# Patient Record
Sex: Male | Born: 1940 | Race: White | Hispanic: No | Marital: Married | Smoking: Former smoker
Health system: Southern US, Community
[De-identification: ages and names within clinical notes are randomized; demographics above are authoritative.]

## PROBLEM LIST (undated history)

## (undated) DIAGNOSIS — R112 Nausea with vomiting, unspecified: Secondary | ICD-10-CM

## (undated) DIAGNOSIS — IMO0001 Reserved for inherently not codable concepts without codable children: Secondary | ICD-10-CM

## (undated) DIAGNOSIS — Z9889 Other specified postprocedural states: Secondary | ICD-10-CM

## (undated) DIAGNOSIS — I10 Essential (primary) hypertension: Secondary | ICD-10-CM

## (undated) DIAGNOSIS — J45909 Unspecified asthma, uncomplicated: Secondary | ICD-10-CM

## (undated) DIAGNOSIS — N189 Chronic kidney disease, unspecified: Secondary | ICD-10-CM

## (undated) DIAGNOSIS — N289 Disorder of kidney and ureter, unspecified: Secondary | ICD-10-CM

## (undated) DIAGNOSIS — H9190 Unspecified hearing loss, unspecified ear: Secondary | ICD-10-CM

## (undated) DIAGNOSIS — J189 Pneumonia, unspecified organism: Secondary | ICD-10-CM

## (undated) DIAGNOSIS — K56600 Partial intestinal obstruction, unspecified as to cause: Secondary | ICD-10-CM

## (undated) DIAGNOSIS — K219 Gastro-esophageal reflux disease without esophagitis: Secondary | ICD-10-CM

## (undated) DIAGNOSIS — M1990 Unspecified osteoarthritis, unspecified site: Secondary | ICD-10-CM

## (undated) DIAGNOSIS — M109 Gout, unspecified: Secondary | ICD-10-CM

## (undated) DIAGNOSIS — J449 Chronic obstructive pulmonary disease, unspecified: Secondary | ICD-10-CM

## (undated) HISTORY — DX: Gastro-esophageal reflux disease without esophagitis: K21.9

## (undated) HISTORY — PX: CATARACT EXTRACTION W/ INTRAOCULAR LENS  IMPLANT, BILATERAL: SHX1307

## (undated) HISTORY — PX: APPENDECTOMY: SHX54

## (undated) HISTORY — DX: Essential (primary) hypertension: I10

## (undated) HISTORY — DX: Chronic kidney disease, unspecified: N18.9

## (undated) HISTORY — DX: Chronic obstructive pulmonary disease, unspecified: J44.9

## (undated) HISTORY — PX: PROSTATE SURGERY: SHX751

## (undated) HISTORY — PX: CARDIAC CATHETERIZATION: SHX172

## (undated) HISTORY — PX: TONSILECTOMY/ADENOIDECTOMY WITH MYRINGOTOMY: SHX6125

## (undated) HISTORY — DX: Unspecified asthma, uncomplicated: J45.909

## (undated) HISTORY — PX: POSTERIOR LUMBAR FUSION: SHX6036

## (undated) HISTORY — PX: NASAL SEPTUM SURGERY: SHX37

---
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
## 1983-11-04 DIAGNOSIS — Z9889 Other specified postprocedural states: Secondary | ICD-10-CM

## 1983-11-04 DIAGNOSIS — R112 Nausea with vomiting, unspecified: Secondary | ICD-10-CM

## 1983-11-04 HISTORY — DX: Nausea with vomiting, unspecified: R11.2

## 1983-11-04 HISTORY — DX: Other specified postprocedural states: Z98.890

## 2014-11-10 ENCOUNTER — Encounter: Payer: Self-pay | Admitting: *Deleted

## 2014-11-30 ENCOUNTER — Telehealth: Payer: Self-pay | Admitting: *Deleted

## 2014-11-30 ENCOUNTER — Other Ambulatory Visit: Payer: Self-pay | Admitting: Family Medicine

## 2014-11-30 ENCOUNTER — Encounter: Payer: Self-pay | Admitting: Family Medicine

## 2014-11-30 ENCOUNTER — Ambulatory Visit (INDEPENDENT_AMBULATORY_CARE_PROVIDER_SITE_OTHER): Payer: Medicare Other | Admitting: Family Medicine

## 2014-11-30 VITALS — BP 138/72 | HR 88 | Temp 98.0°F | Resp 12 | Ht 66.54 in | Wt 209.0 lb

## 2014-11-30 DIAGNOSIS — Z23 Encounter for immunization: Secondary | ICD-10-CM | POA: Diagnosis not present

## 2014-11-30 DIAGNOSIS — M1000 Idiopathic gout, unspecified site: Secondary | ICD-10-CM | POA: Diagnosis not present

## 2014-11-30 DIAGNOSIS — Z1211 Encounter for screening for malignant neoplasm of colon: Secondary | ICD-10-CM

## 2014-11-30 DIAGNOSIS — Z0000 Encounter for general adult medical examination without abnormal findings: Secondary | ICD-10-CM | POA: Diagnosis not present

## 2014-11-30 LAB — COMPLETE METABOLIC PANEL WITH GFR
ALK PHOS: 53 U/L (ref 39–117)
ALT: 16 U/L (ref 0–53)
AST: 22 U/L (ref 0–37)
Albumin: 4.3 g/dL (ref 3.5–5.2)
BILIRUBIN TOTAL: 0.5 mg/dL (ref 0.2–1.2)
BUN: 27 mg/dL — ABNORMAL HIGH (ref 6–23)
CALCIUM: 8.7 mg/dL (ref 8.4–10.5)
CO2: 23 meq/L (ref 19–32)
CREATININE: 1.41 mg/dL — AB (ref 0.50–1.35)
Chloride: 105 mEq/L (ref 96–112)
GFR, EST AFRICAN AMERICAN: 57 mL/min — AB
GFR, EST NON AFRICAN AMERICAN: 49 mL/min — AB
Glucose, Bld: 98 mg/dL (ref 70–99)
Potassium: 4.2 mEq/L (ref 3.5–5.3)
Sodium: 142 mEq/L (ref 135–145)
Total Protein: 6.7 g/dL (ref 6.0–8.3)

## 2014-11-30 LAB — CBC WITH DIFFERENTIAL/PLATELET
Basophils Absolute: 0.1 10*3/uL (ref 0.0–0.1)
Basophils Relative: 1 % (ref 0–1)
EOS ABS: 0.3 10*3/uL (ref 0.0–0.7)
Eosinophils Relative: 4 % (ref 0–5)
HEMATOCRIT: 44.7 % (ref 39.0–52.0)
Hemoglobin: 15.3 g/dL (ref 13.0–17.0)
LYMPHS ABS: 1.9 10*3/uL (ref 0.7–4.0)
LYMPHS PCT: 27 % (ref 12–46)
MCH: 29.1 pg (ref 26.0–34.0)
MCHC: 34.2 g/dL (ref 30.0–36.0)
MCV: 85 fL (ref 78.0–100.0)
MONO ABS: 0.9 10*3/uL (ref 0.1–1.0)
MPV: 9.2 fL (ref 8.6–12.4)
Monocytes Relative: 12 % (ref 3–12)
NEUTROS ABS: 4 10*3/uL (ref 1.7–7.7)
NEUTROS PCT: 56 % (ref 43–77)
PLATELETS: 186 10*3/uL (ref 150–400)
RBC: 5.26 MIL/uL (ref 4.22–5.81)
RDW: 14.8 % (ref 11.5–15.5)
WBC: 7.1 10*3/uL (ref 4.0–10.5)

## 2014-11-30 LAB — URIC ACID: Uric Acid, Serum: 6.6 mg/dL (ref 4.0–7.8)

## 2014-11-30 LAB — LIPID PANEL
CHOLESTEROL: 177 mg/dL (ref 0–200)
HDL: 43 mg/dL (ref >39)
LDL CALC: 112 mg/dL — AB (ref 0–99)
TRIGLYCERIDES: 112 mg/dL (ref <150)
Total CHOL/HDL Ratio: 4.1 Ratio
VLDL: 22 mg/dL (ref 0–40)

## 2014-11-30 NOTE — Telephone Encounter (Signed)
Pt wife called stating she went to [facility] to pick up pts ear gtts and pharmacist says there isnt a prescription sent in for him. Pt says you were going to call something in for him when they were here today. Please advise!  [place]

## 2014-11-30 NOTE — Progress Notes (Signed)
Subjective:    Patient ID: Larry Butler, male    DOB: Aug 16, 1941, 74 y.o.   MRN: [id number]  HPI Patient is a very pleasant 74 year old white male who is here today to establish care and for complete physical exam. He is also requesting referrals to a urologist as well as a cardiologist. Patient has a history of BPH and underwent a TUNA proximally one year ago. He is due to see his urologist in [place] again in August. He would like to schedule follow-up with a urologist here in [facility] for August. He also apparently has been seeing a cardiologist however the reason is not clear and coronary artery disease, cardiac arrhythmia, congestive heart failure. However he was seen a cardiologist in [place]. Patient has had Pneumovax 23 as well as the shingles vaccine and his flu shot. He is due for the tetanus vaccine as well as Prevnar 13. He is also due for a colonoscopy as he has a history of polyps, a family history of colon cancer, and he has not had a colonoscopy in more than 5 years. Past Medical History  Diagnosis Date  . Asthma   . COPD (chronic obstructive pulmonary disease)   . GERD (gastroesophageal reflux disease)   . Chronic kidney disease    Past Surgical History  Procedure Laterality Date  . Appendectomy    . Tonsilectomy/adenoidectomy with myringotomy    . Back surgery      fusion 1980's  . Prostate surgery     Current Outpatient Prescriptions on File Prior to Visit  Medication Sig Dispense Refill  . albuterol (PROVENTIL HFA;VENTOLIN HFA) 108 (90 BASE) MCG/ACT inhaler Inhale 2 puffs into the lungs every 6 (six) hours as needed for wheezing or shortness of breath.    . allopurinol (ZYLOPRIM) 300 MG tablet Take 300 mg by mouth daily.    Marland Kitchen aspirin EC 81 MG tablet Take 81 mg by mouth daily.    Marland Kitchen esomeprazole (NEXIUM) 40 MG capsule Take 40 mg by mouth daily at 12 noon.    . Multiple Vitamins-Minerals (CENTRUM SILVER PO) Take 1 tablet by mouth daily.     No current  facility-administered medications on file prior to visit.   No Known Allergies History   Social History  . Marital Status: Married    Spouse Name: N/A    Number of Children: N/A  . Years of Education: N/A   Occupational History  . Not on file.   Social History Main Topics  . Smoking status: Former Smoker    Quit date: 09/03/1977  . Smokeless tobacco: Never Used  . Alcohol Use: No  . Drug Use: No  . Sexual Activity: Yes   Other Topics Concern  . Not on file   Social History Narrative   Family History  Problem Relation Age of Onset  . Cancer Mother     breast  . Stroke Mother   . Cancer Father 73    colon  . Diabetes Sister   . Heart disease Brother   . Diabetes Brother   . Hearing loss Brother   . Diabetes Brother   . Diabetes Brother   . Diabetes Brother   . Heart disease Brother   . Hearing loss Brother   . Cancer Brother     prostate  . Heart disease Brother   . Hearing loss Brother   . COPD Brother       Review of Systems  All other systems reviewed and are negative.  Objective:   Physical Exam  Constitutional: He is oriented to person, place, and time. He appears well-developed and well-nourished. No distress.  HENT:  Head: Normocephalic and atraumatic.  Right Ear: External ear normal.  Left Ear: External ear normal.  Nose: Nose normal.  Mouth/Throat: Oropharynx is clear and moist. No oropharyngeal exudate.  Eyes: Conjunctivae and EOM are normal. Pupils are equal, round, and reactive to light. Right eye exhibits no discharge. Left eye exhibits no discharge. No scleral icterus.  Neck: Normal range of motion. Neck supple. No JVD present. No tracheal deviation present. No thyromegaly present.  Cardiovascular: Normal rate, regular rhythm, normal heart sounds and intact distal pulses.  Exam reveals no gallop and no friction rub.   No murmur heard. Pulmonary/Chest: Effort normal and breath sounds normal. No stridor. No respiratory distress. He  has no wheezes. He has no rales. He exhibits no tenderness.  Abdominal: Soft. Bowel sounds are normal. He exhibits no distension and no mass. There is no tenderness. There is no rebound and no guarding.  Musculoskeletal: Normal range of motion. He exhibits no edema or tenderness.  Lymphadenopathy:    He has no cervical adenopathy.  Neurological: He is alert and oriented to person, place, and time. He has normal reflexes. He displays normal reflexes. No cranial nerve deficit. He exhibits normal muscle tone. Coordination normal.  Skin: Skin is warm. No rash noted. He is not diaphoretic. No erythema. No pallor.  Psychiatric: He has a normal mood and affect. His behavior is normal. Judgment and thought content normal.  Vitals reviewed.         Assessment & Plan:  Routine general medical examination at a health care facility - Plan: COMPLETE METABOLIC PANEL WITH GFR, Lipid panel, CBC with Differential/Platelet  Acute idiopathic gout, unspecified site - Plan: Uric acid  Need for prophylactic vaccination against Streptococcus pneumoniae (pneumococcus) - Plan: Pneumococcal conjugate vaccine 13-valent  Need for prophylactic vaccination with combined diphtheria-tetanus-pertussis (DTP) vaccine - Plan: Tdap vaccine greater than or equal to 7yo IM  Patient was given a tetanus vaccine today. He was also given Prevnar 13. I will schedule the patient to see a gastroenterologist for colonoscopy. The patient will call me in August when he is ready for me to refer him to a urologist. I have asked to receive a copy of his previous medical records to determine the reason the patient needs to be referred to a cardiologist. I will also check a CBC, CMP, fasting lipid panel, and uric acid level.

## 2014-12-01 ENCOUNTER — Other Ambulatory Visit: Payer: Self-pay | Admitting: Family Medicine

## 2014-12-01 MED ORDER — NEOMYCIN-POLYMYXIN-HC 3.5-10000-1 OT SOLN: 4.0000 [drp] | Freq: Four times a day (QID) | OTIC | Status: DC

## 2014-12-01 NOTE — Telephone Encounter (Signed)
Called and spoke to pt wife Gregary Signs and she is aware that script had been called in

## 2014-12-01 NOTE — Telephone Encounter (Signed)
Please tell him I am sorry and I have sent the rx right now.

## 2014-12-11 ENCOUNTER — Other Ambulatory Visit: Payer: Self-pay | Admitting: Family Medicine

## 2014-12-11 DIAGNOSIS — I2510 Atherosclerotic heart disease of native coronary artery without angina pectoris: Secondary | ICD-10-CM

## 2014-12-18 ENCOUNTER — Telehealth: Payer: Self-pay | Admitting: Cardiology

## 2014-12-18 NOTE — Telephone Encounter (Signed)
Received records from [facility] for appointment with Dr Percival Spanish on 02/05/15.  Records given to [facility] (medical records) for  Dr Hochrein's schedule on 02/05/15.  lp

## 2015-01-31 ENCOUNTER — Encounter: Payer: Self-pay | Admitting: *Deleted

## 2015-02-03 ENCOUNTER — Other Ambulatory Visit: Payer: Self-pay | Admitting: Family Medicine

## 2015-02-05 ENCOUNTER — Ambulatory Visit (INDEPENDENT_AMBULATORY_CARE_PROVIDER_SITE_OTHER): Payer: Medicare Other | Admitting: Cardiology

## 2015-02-05 ENCOUNTER — Encounter: Payer: Self-pay | Admitting: Cardiology

## 2015-02-05 VITALS — BP 142/84 | HR 69 | Ht 67.0 in | Wt 208.3 lb

## 2015-02-05 DIAGNOSIS — I2510 Atherosclerotic heart disease of native coronary artery without angina pectoris: Secondary | ICD-10-CM

## 2015-02-05 NOTE — Progress Notes (Signed)
Cardiology Office Note   Date:  02/05/2015   ID:  Larry Butler, DOB [birth year], MRN [id number]  PCP:  Odette Fraction, MD  Cardiologist:   Minus Breeding, MD   Chief Complaint  Patient presents with  . Coronary Artery Disease      History of Present Illness: Larry Butler is a 74 y.o. male who presents for follow-up of coronary disease.  He has moved to this area with his wife. He's been followed for years by a cardiologist but has not had any severe problems as far as I can tell.  I did review old records demonstrating a cardiac catheterization in 2008 with apparently nonobstructive disease. A 2013 stress test demonstrated some mild mid inferior ischemia but this was apparently managed medically. His last stress test was in February 2015. He also reports having a cardiac cath which I did not again it was nonobstructive disease.  He is known to have some very mild carotid plaque.  Echocardiogram that I reviewed 2013 demonstrated well-preserved ejection fraction. There was some mild aortic stenosis.  He does quite well. He is very active and actually tons of dirt recently. The patient denies any new symptoms such as chest discomfort, neck or arm discomfort. There has been no new shortness of breath, PND or orthopnea. There have been no reported palpitations, presyncope or syncope.   Past Medical History  Diagnosis Date  . Asthma   . COPD (chronic obstructive pulmonary disease)   . GERD (gastroesophageal reflux disease)   . Chronic kidney disease     Past Surgical History  Procedure Laterality Date  . Appendectomy    . Tonsilectomy/adenoidectomy with myringotomy    . Back surgery      fusion 1980's  . Prostate surgery       Current Outpatient Prescriptions  Medication Sig Dispense Refill  . acetaminophen (TYLENOL) 500 MG tablet Take 500 mg by mouth every 6 (six) hours as needed.    Marland Kitchen albuterol (PROVENTIL HFA;VENTOLIN HFA) 108 (90 BASE) MCG/ACT inhaler Inhale 2  puffs into the lungs every 6 (six) hours as needed for wheezing or shortness of breath.    . allopurinol (ZYLOPRIM) 300 MG tablet Take 300 mg by mouth daily.    Marland Kitchen aspirin EC 81 MG tablet Take 81 mg by mouth daily.    Marland Kitchen esomeprazole (NEXIUM) 40 MG capsule Take 40 mg by mouth daily as needed.     . fluticasone (FLONASE) 50 MCG/ACT nasal spray Place 2 sprays into both nostrils daily as needed.     . Multiple Vitamins-Minerals (CENTRUM SILVER PO) Take 1 tablet by mouth daily.    Marland Kitchen neomycin-polymyxin-hydrocortisone (CORTISPORIN) otic solution Place 4 drops into the right ear 4 (four) times daily. 10 mL 0  . Simethicone 250 MG CAPS Take 1 capsule by mouth as needed.     No current facility-administered medications for this visit.    Allergies:   Review of patient's allergies indicates no known allergies.    Social History:  The patient  reports that he quit smoking about 37 years ago. He has never used smokeless tobacco. He reports that he does not drink alcohol or use illicit drugs.   Family History:  The patient's family history includes COPD in his brother; Cancer in his brother and mother; Cancer (age of onset: 52) in his father; Diabetes in his brother, brother, brother, brother, and sister; Hearing loss in his brother, brother, and brother; Heart disease in his brother; Heart disease (age  of onset: 68) in his brother; Heart disease (age of onset: 36) in his brother; Stroke in his mother.   Of note he has many siblings and it seems like most of his brothers have some heart disease though we don't know all the details.   ROS:  Please see the history of present illness.   Otherwise, review of systems are positive for temporal headaches, reflux.   All other systems are reviewed and negative.    PHYSICAL EXAM: VS:  BP 142/84 mmHg  Pulse 69  Ht 5\' 7"  (1.702 m)  Wt 208 lb 4.8 oz (94.484 kg)  BMI 32.62 kg/m2 , BMI Body mass index is 32.62 kg/(m^2). GENERAL:  Well appearing HEENT:  Pupils equal  round and reactive, fundi not visualized, oral mucosa unremarkable NECK:  No jugular venous distention, waveform within normal limits, carotid upstroke brisk and symmetric, no bruits, no thyromegaly LYMPHATICS:  No cervical, inguinal adenopathy LUNGS:  Clear to auscultation bilaterally BACK:  No CVA tenderness CHEST:  Unremarkable HEART:  PMI not displaced or sustained,S1 and S2 within normal limits, no S3, no S4, no clicks, no rubs, no murmurs ABD:  Flat, positive bowel sounds normal in frequency in pitch, no bruits, no rebound, no guarding, no midline pulsatile mass, no hepatomegaly, no splenomegaly EXT:  2 plus pulses throughout, no edema, no cyanosis no clubbing SKIN:  No rashes no nodules NEURO:  Cranial nerves II through XII grossly intact, motor grossly intact throughout PSYCH:  Cognitively intact, oriented to person place and time    EKG:  EKG is ordered today. The ekg ordered today demonstrates sinus rhythm, rate 69, axis within normal limits, intervals within normal limits, no acute ST-T wave changes.   Recent Labs: 11/30/2014: ALT 16; BUN 27*; Creatinine 1.41*; Hemoglobin 15.3; Platelets 186; Potassium 4.2; Sodium 142    Lipid Panel    Component Value Date/Time   CHOL 177 11/30/2014 0920   TRIG 112 11/30/2014 0920   HDL 43 11/30/2014 0920   CHOLHDL 4.1 11/30/2014 0920   VLDL 22 11/30/2014 0920   LDLCALC 112* 11/30/2014 0920      Wt Readings from Last 3 Encounters:  02/05/15 208 lb 4.8 oz (94.484 kg)  11/30/14 209 lb (94.802 kg)      Other studies Reviewed: Additional studies/ records that were reviewed today include: Extensive outside records. Review of the above records demonstrates:  Please see elsewhere in the note.     ASSESSMENT AND PLAN:  CAD: This is mild and nonobstructive. No further testing needs to be done.:    TEMPORAL PAIN:  This is his most significant complaint. He's had a workup. I suggested possible neurology evaluation since he was not  thought to have temporal arteritis by his report previously.   HTN:  The blood pressure is very mildly elevated. However, this is unusual. No change in therapy is indicated.  CAROTID STENOSIS:  He's had some very mild plaque. I will follow-up on this at the next visit.   Current medicines are reviewed at length with the patient today.  The patient does not have concerns regarding medicines.  The following changes have been made:  no change  Labs/ tests ordered today include: none    Disposition:   FU with me in one year.    Signed, Minus Breeding, MD  02/05/2015 8:37 AM    [facility]

## 2015-02-05 NOTE — Patient Instructions (Signed)
Your physician recommends that you schedule a follow-up appointment in:  One year with Dr. Hochrein  

## 2015-02-06 ENCOUNTER — Telehealth: Payer: Self-pay | Admitting: Cardiology

## 2015-02-06 MED ORDER — AMLODIPINE BESYLATE 2.5 MG PO TABS: 2.5000 mg | ORAL_TABLET | Freq: Every evening | ORAL | Status: DC

## 2015-02-06 NOTE — Telephone Encounter (Signed)
Spoke to pt and advised on new med. She voiced understanding. Will start new med this evening & track BPs this week.

## 2015-02-06 NOTE — Telephone Encounter (Signed)
Pt's wife is calling in to report his most recent BP readings.

## 2015-02-06 NOTE — Telephone Encounter (Signed)
[id number]

## 2015-02-06 NOTE — Telephone Encounter (Signed)
Wife reported pt's BPs.  4/4 8pm - 153/70    HR 67  4/5 7:41am   197/105   HR 103    (during intercourse, & he had temple pain assoc w/ this)       7:48am   163/79    HR   95    (post-intercourse)       7:51am   135/70    HR   81  She wanted Dr. Percival Spanish aware of this, per their discussion at [place] yesterday. Would like a callback w/ any advice.

## 2015-02-06 NOTE — Telephone Encounter (Signed)
I would suggest starting 2.5 mg of Norvasc each evening.

## 2015-03-07 ENCOUNTER — Other Ambulatory Visit: Payer: Self-pay | Admitting: Family Medicine

## 2015-03-07 MED ORDER — ESOMEPRAZOLE MAGNESIUM 40 MG PO CPDR: 40.0000 mg | DELAYED_RELEASE_CAPSULE | Freq: Every day | ORAL | Status: DC | PRN

## 2015-03-07 MED ORDER — ALBUTEROL SULFATE HFA 108 (90 BASE) MCG/ACT IN AERS: 2.0000 | INHALATION_SPRAY | Freq: Four times a day (QID) | RESPIRATORY_TRACT | Status: DC | PRN

## 2015-03-07 MED ORDER — ALLOPURINOL 300 MG PO TABS: 300.0000 mg | ORAL_TABLET | Freq: Every day | ORAL | Status: DC

## 2015-03-16 ENCOUNTER — Other Ambulatory Visit: Payer: Self-pay | Admitting: Family Medicine

## 2015-03-16 ENCOUNTER — Telehealth: Payer: Self-pay | Admitting: Cardiology

## 2015-03-16 ENCOUNTER — Other Ambulatory Visit: Payer: Self-pay

## 2015-03-16 MED ORDER — AMLODIPINE BESYLATE 2.5 MG PO TABS: 2.5000 mg | ORAL_TABLET | Freq: Every evening | ORAL | Status: DC

## 2015-03-16 NOTE — Telephone Encounter (Signed)
°  1. Which medications need to be refilled? Amlodipine-need new prescription,going on a trip.he does not want it to run out  2. Which pharmacy is medication to be sent to?[facility]-[phone]  3. Do they need a 30 day or 90 day supply? #60 and refills  4. Would they like a call back once the medication has been sent to the pharmacy? yes

## 2015-06-05 ENCOUNTER — Ambulatory Visit (INDEPENDENT_AMBULATORY_CARE_PROVIDER_SITE_OTHER): Payer: Medicare Other | Admitting: Family Medicine

## 2015-06-05 ENCOUNTER — Encounter: Payer: Self-pay | Admitting: Family Medicine

## 2015-06-05 VITALS — BP 120/74 | HR 80 | Temp 98.2°F | Resp 16 | Ht 67.0 in | Wt 212.0 lb

## 2015-06-05 DIAGNOSIS — I10 Essential (primary) hypertension: Secondary | ICD-10-CM | POA: Diagnosis not present

## 2015-06-05 LAB — COMPLETE METABOLIC PANEL WITH GFR
ALBUMIN: 4.2 g/dL (ref 3.6–5.1)
ALK PHOS: 53 U/L (ref 40–115)
ALT: 20 U/L (ref 9–46)
AST: 20 U/L (ref 10–35)
BUN: 21 mg/dL (ref 7–25)
CALCIUM: 9.1 mg/dL (ref 8.6–10.3)
CHLORIDE: 105 mmol/L (ref 98–110)
CO2: 26 mmol/L (ref 20–31)
CREATININE: 1.34 mg/dL — AB (ref 0.70–1.18)
GFR, EST NON AFRICAN AMERICAN: 53 mL/min — AB (ref >=60)
GFR, Est African American: 61 mL/min (ref >=60)
Glucose, Bld: 97 mg/dL (ref 70–99)
Potassium: 4 mmol/L (ref 3.5–5.3)
Sodium: 143 mmol/L (ref 135–146)
TOTAL PROTEIN: 6.7 g/dL (ref 6.1–8.1)
Total Bilirubin: 0.6 mg/dL (ref 0.2–1.2)

## 2015-06-05 LAB — CBC WITH DIFFERENTIAL/PLATELET
BASOS PCT: 1 % (ref 0–1)
Basophils Absolute: 0.1 10*3/uL (ref 0.0–0.1)
EOS PCT: 3 % (ref 0–5)
Eosinophils Absolute: 0.2 10*3/uL (ref 0.0–0.7)
HEMATOCRIT: 43 % (ref 39.0–52.0)
Hemoglobin: 15 g/dL (ref 13.0–17.0)
Lymphocytes Relative: 23 % (ref 12–46)
Lymphs Abs: 1.3 10*3/uL (ref 0.7–4.0)
MCH: 29.8 pg (ref 26.0–34.0)
MCHC: 34.9 g/dL (ref 30.0–36.0)
MCV: 85.5 fL (ref 78.0–100.0)
MONOS PCT: 9 % (ref 3–12)
MPV: 9 fL (ref 8.6–12.4)
Monocytes Absolute: 0.5 10*3/uL (ref 0.1–1.0)
NEUTROS ABS: 3.7 10*3/uL (ref 1.7–7.7)
Neutrophils Relative %: 64 % (ref 43–77)
PLATELETS: 191 10*3/uL (ref 150–400)
RBC: 5.03 MIL/uL (ref 4.22–5.81)
RDW: 14.8 % (ref 11.5–15.5)
WBC: 5.8 10*3/uL (ref 4.0–10.5)

## 2015-06-05 LAB — LIPID PANEL
CHOL/HDL RATIO: 3.9 ratio (ref <=5.0)
Cholesterol: 182 mg/dL (ref 125–200)
HDL: 47 mg/dL (ref >=40)
LDL CALC: 119 mg/dL (ref <130)
Triglycerides: 82 mg/dL (ref <150)
VLDL: 16 mg/dL (ref <30)

## 2015-06-05 NOTE — Progress Notes (Signed)
Subjective:    Patient ID: Larry Butler, male    DOB: [birth year], 74 y.o.   MRN: [id number]  HPI  Patient has been doing very well since when I last saw him. He is renovating his bathroom. His blood pressure today is well controlled at 120/74 he continues to take Norvasc 2.5 mg by mouth daily. He denies any chest pain shortness of breath or dyspnea on exertion. I have discussed with the patient possibly discontinuing the medication as his blood pressure is so well controlled. However the patient mainly takes the blood pressure  Medication because he would experience extreme spikes in his blood pressure was sexual intercourse. Otherwise he has been doing well. Unfortunately his wife was recently admitted to the hospital with vasovagal syncope, elevated liver function tests secondary to choledocholithiasis and cholecystitis. They're planning a cholecystectomy for tomorrow. Past Medical History  Diagnosis Date  . Asthma   . COPD (chronic obstructive pulmonary disease)   . GERD (gastroesophageal reflux disease)   . Chronic kidney disease    Past Surgical History  Procedure Laterality Date  . Appendectomy    . Tonsilectomy/adenoidectomy with myringotomy    . Back surgery      fusion 1980's  . Prostate surgery     Current Outpatient Prescriptions on File Prior to Visit  Medication Sig Dispense Refill  . albuterol (PROVENTIL HFA;VENTOLIN HFA) 108 (90 BASE) MCG/ACT inhaler Inhale 2 puffs into the lungs every 6 (six) hours as needed for wheezing or shortness of breath. 1 Inhaler 5  . allopurinol (ZYLOPRIM) 300 MG tablet Take 1 tablet (300 mg total) by mouth daily. 30 tablet 5  . amLODipine (NORVASC) 2.5 MG tablet Take 1 tablet (2.5 mg total) by mouth every evening. 30 tablet 6  . aspirin EC 81 MG tablet Take 81 mg by mouth daily.    Marland Kitchen esomeprazole (NEXIUM) 40 MG capsule Take 1 capsule (40 mg total) by mouth daily as needed. 30 capsule 11  . fluticasone (FLONASE) 50 MCG/ACT nasal spray  Place 2 sprays into both nostrils daily as needed.     . Multiple Vitamins-Minerals (CENTRUM SILVER PO) Take 1 tablet by mouth daily.    Marland Kitchen neomycin-polymyxin-hydrocortisone (CORTISPORIN) otic solution INSTILL 4 DROPS IN RIGHT EAR FOUR TIMES DAILY 10 mL 0   No current facility-administered medications on file prior to visit.   No Known Allergies History   Social History  . Marital Status: Married    Spouse Name: N/A  . Number of Children: N/A  . Years of Education: N/A   Occupational History  . Not on file.   Social History Main Topics  . Smoking status: Former Smoker    Quit date: 09/03/1977  . Smokeless tobacco: Never Used  . Alcohol Use: No  . Drug Use: No  . Sexual Activity: Yes   Other Topics Concern  . Not on file   Social History Narrative   One kind of adopted daughter.       Review of Systems  All other systems reviewed and are negative.      Objective:   Physical Exam  Constitutional: He appears well-developed and well-nourished.  Cardiovascular: Normal rate, regular rhythm and normal heart sounds.   No murmur heard. Pulmonary/Chest: Effort normal and breath sounds normal. No respiratory distress. He has no wheezes. He has no rales.  Abdominal: Soft. Bowel sounds are normal. He exhibits no distension. There is no tenderness. There is no rebound and no guarding.  Musculoskeletal: He exhibits no  edema.  Vitals reviewed.         Assessment & Plan:  Benign essential HTN - Plan: COMPLETE METABOLIC PANEL WITH GFR, Lipid panel, CBC with Differential/Platelet  Patient's blood pressure today is well controlled. I will check a fasting lipid panel. Goal LDL cholesterol is less than 130. I will also monitor his renal function as well as his liver function test.

## 2015-06-13 ENCOUNTER — Encounter: Payer: Self-pay | Admitting: Family Medicine

## 2015-06-13 ENCOUNTER — Ambulatory Visit (INDEPENDENT_AMBULATORY_CARE_PROVIDER_SITE_OTHER): Payer: Medicare Other | Admitting: Family Medicine

## 2015-06-13 VITALS — BP 124/78 | HR 76 | Temp 98.2°F | Resp 18 | Wt 210.0 lb

## 2015-06-13 DIAGNOSIS — S83282A Other tear of lateral meniscus, current injury, left knee, initial encounter: Secondary | ICD-10-CM

## 2015-06-13 NOTE — Progress Notes (Signed)
Subjective:    Patient ID: Larry Butler, male    DOB: 08-20-1941, 74 y.o.   MRN: [id number]  HPI Patient presents with several months of left knee pain. Recently it has worsened while he has been renovating his bathroom. He has severe pain over the left medial joint line. The pain seems to be centered posteriorly. On examination today, he has pain with varus and valgus stress on the there is no laxity. He also has a positive Apley grind concerning for a meniscal tear. He also has a slight joint effusion but no erythema Past Medical History  Diagnosis Date  . Asthma   . COPD (chronic obstructive pulmonary disease)   . GERD (gastroesophageal reflux disease)   . Chronic kidney disease    Past Surgical History  Procedure Laterality Date  . Appendectomy    . Tonsilectomy/adenoidectomy with myringotomy    . Back surgery      fusion 1980's  . Prostate surgery     Current Outpatient Prescriptions on File Prior to Visit  Medication Sig Dispense Refill  . albuterol (PROVENTIL HFA;VENTOLIN HFA) 108 (90 BASE) MCG/ACT inhaler Inhale 2 puffs into the lungs every 6 (six) hours as needed for wheezing or shortness of breath. 1 Inhaler 5  . allopurinol (ZYLOPRIM) 300 MG tablet Take 1 tablet (300 mg total) by mouth daily. 30 tablet 5  . amLODipine (NORVASC) 2.5 MG tablet Take 1 tablet (2.5 mg total) by mouth every evening. 30 tablet 6  . aspirin EC 81 MG tablet Take 81 mg by mouth daily.    Marland Kitchen esomeprazole (NEXIUM) 40 MG capsule Take 1 capsule (40 mg total) by mouth daily as needed. 30 capsule 11  . fluticasone (FLONASE) 50 MCG/ACT nasal spray Place 2 sprays into both nostrils daily as needed.     . Multiple Vitamins-Minerals (CENTRUM SILVER PO) Take 1 tablet by mouth daily.    Marland Kitchen neomycin-polymyxin-hydrocortisone (CORTISPORIN) otic solution INSTILL 4 DROPS IN RIGHT EAR FOUR TIMES DAILY 10 mL 0   No current facility-administered medications on file prior to visit.   No Known Allergies Social  History   Social History  . Marital Status: Married    Spouse Name: N/A  . Number of Children: N/A  . Years of Education: N/A   Occupational History  . Not on file.   Social History Main Topics  . Smoking status: Former Smoker    Quit date: 09/03/1977  . Smokeless tobacco: Never Used  . Alcohol Use: No  . Drug Use: No  . Sexual Activity: Yes   Other Topics Concern  . Not on file   Social History Narrative   One kind of adopted daughter.        Review of Systems  All other systems reviewed and are negative.      Objective:   Physical Exam  Cardiovascular: Normal rate, regular rhythm and normal heart sounds.   Pulmonary/Chest: Effort normal and breath sounds normal.  Musculoskeletal:       Left knee: He exhibits decreased range of motion, swelling, effusion and abnormal meniscus. He exhibits no LCL laxity and no MCL laxity. Tenderness found. Medial joint line and MCL tenderness noted.  Vitals reviewed.         Assessment & Plan:  Lateral meniscal tear, left, initial encounter  I suspect the patient has a meniscal tear. After obtaining informed consent, I injected the left knee with a mixture of 2 mL of lidocaine, 2 mL of Marcaine, and 2 mL  of 40 mg per mL Kenalog. The patient tolerated procedure well without complication. If pain worsens or does not improve, I would next proceed with an MRI of the left knee.

## 2015-07-05 ENCOUNTER — Encounter: Payer: Self-pay | Admitting: Family Medicine

## 2015-07-05 ENCOUNTER — Ambulatory Visit (INDEPENDENT_AMBULATORY_CARE_PROVIDER_SITE_OTHER): Payer: Medicare Other | Admitting: Family Medicine

## 2015-07-05 ENCOUNTER — Other Ambulatory Visit: Payer: Self-pay | Admitting: Family Medicine

## 2015-07-05 VITALS — BP 132/80 | HR 78 | Temp 98.9°F | Resp 18 | Ht 67.0 in | Wt 212.0 lb

## 2015-07-05 DIAGNOSIS — R300 Dysuria: Secondary | ICD-10-CM | POA: Diagnosis not present

## 2015-07-05 DIAGNOSIS — R252 Cramp and spasm: Secondary | ICD-10-CM | POA: Diagnosis not present

## 2015-07-05 LAB — BASIC METABOLIC PANEL WITH GFR
BUN: 16 mg/dL (ref 7–25)
CO2: 27 mmol/L (ref 20–31)
Calcium: 9 mg/dL (ref 8.6–10.3)
Chloride: 102 mmol/L (ref 98–110)
Creat: 1.33 mg/dL — ABNORMAL HIGH (ref 0.70–1.18)
GFR, EST AFRICAN AMERICAN: 61 mL/min (ref >=60)
GFR, EST NON AFRICAN AMERICAN: 53 mL/min — AB (ref >=60)
Glucose, Bld: 92 mg/dL (ref 70–99)
Potassium: 4.2 mmol/L (ref 3.5–5.3)
Sodium: 138 mmol/L (ref 135–146)

## 2015-07-05 LAB — URINALYSIS, ROUTINE W REFLEX MICROSCOPIC
Bilirubin Urine: NEGATIVE
Glucose, UA: NEGATIVE
HGB URINE DIPSTICK: NEGATIVE
KETONES UR: NEGATIVE
LEUKOCYTES UA: NEGATIVE
NITRITE: NEGATIVE
PH: 6.5 (ref 5.0–8.0)
PROTEIN: NEGATIVE
Specific Gravity, Urine: 1.01 (ref 1.001–1.035)

## 2015-07-05 MED ORDER — HYDROXYCHLOROQUINE SULFATE 200 MG PO TABS: 200.0000 mg | ORAL_TABLET | Freq: Every day | ORAL | Status: DC

## 2015-07-05 NOTE — Telephone Encounter (Signed)
Refill appropriate and filled per protocol. 

## 2015-07-05 NOTE — Progress Notes (Signed)
Subjective:    Patient ID: Larry Butler, male    DOB: 1941/07/01, 74 y.o.   MRN: [id number]  HPI Patient reports 4 weeks him almost daily constant severe cramps. They're so bad in the evenings, that he frequently wakes up bathing his wife to shoot him because the pain is unrelenting. The cramps will typically be in his feet or in his lower legs. He also reports dark urine with increase foamy bubbles on top. He denies dysuria. He admits that he has not been drinking enough fluid and he has been working harder recently. This likely explains the dark color of the urine as well as his cramps. He is  Trying to address dehydration by increasing his fluid intake over the last day or so however the cramps persist. He has been eating bananas and the cramps continue Past Medical History  Diagnosis Date  . Asthma   . COPD (chronic obstructive pulmonary disease)   . GERD (gastroesophageal reflux disease)   . Chronic kidney disease    Past Surgical History  Procedure Laterality Date  . Appendectomy    . Tonsilectomy/adenoidectomy with myringotomy    . Back surgery      fusion 1980's  . Prostate surgery     Current Outpatient Prescriptions on File Prior to Visit  Medication Sig Dispense Refill  . albuterol (PROVENTIL HFA;VENTOLIN HFA) 108 (90 BASE) MCG/ACT inhaler Inhale 2 puffs into the lungs every 6 (six) hours as needed for wheezing or shortness of breath. 1 Inhaler 5  . allopurinol (ZYLOPRIM) 300 MG tablet Take 1 tablet (300 mg total) by mouth daily. (Patient taking differently: Take 150 mg by mouth daily. ) 30 tablet 5  . amLODipine (NORVASC) 2.5 MG tablet Take 1 tablet (2.5 mg total) by mouth every evening. 30 tablet 6  . aspirin EC 81 MG tablet Take 81 mg by mouth daily.    Marland Kitchen esomeprazole (NEXIUM) 40 MG capsule Take 1 capsule (40 mg total) by mouth daily as needed. 30 capsule 11  . fluticasone (FLONASE) 50 MCG/ACT nasal spray Place 2 sprays into both nostrils daily as needed.     .  Multiple Vitamins-Minerals (CENTRUM SILVER PO) Take 1 tablet by mouth daily.    Marland Kitchen neomycin-polymyxin-hydrocortisone (CORTISPORIN) otic solution INSTILL 4 DROPS IN RIGHT EAR FOUR TIMES DAILY 10 mL 0   No current facility-administered medications on file prior to visit.   No Known Allergies Social History   Social History  . Marital Status: Married    Spouse Name: N/A  . Number of Children: N/A  . Years of Education: N/A   Occupational History  . Not on file.   Social History Main Topics  . Smoking status: Former Smoker    Quit date: 09/03/1977  . Smokeless tobacco: Never Used  . Alcohol Use: No  . Drug Use: No  . Sexual Activity: Yes   Other Topics Concern  . Not on file   Social History Narrative   One kind of adopted daughter.        Review of Systems  All other systems reviewed and are negative.      Objective:   Physical Exam  Cardiovascular: Normal rate, regular rhythm and normal heart sounds.   Pulmonary/Chest: Effort normal and breath sounds normal.  Abdominal: Soft. Bowel sounds are normal. He exhibits no distension. There is no tenderness. There is no rebound.  Musculoskeletal: Normal range of motion. He exhibits no edema or tenderness.  Vitals reviewed.  Assessment & Plan:  Dysuria - Plan: Urinalysis, Routine w reflex microscopic (not at [facility] [place])  Cramps, extremity - Plan: BASIC METABOLIC PANEL WITH GFR, hydroxychloroquine (PLAQUENIL) 200 MG tablet  Patient's urinalysis is normal. I believe the abnormalities he has witnessed his urine have to do a dehydration. I recommended that he increase his fluid intake. This will hopefully help with the cramps as well. Given the severity and persistence of the cramps, we will start the patient on hydroxychloroquine 200 mg by mouth daily. Recheck in 2 weeks. I plan to discontinue this medication in one month once the patient stops renovating his house

## 2015-07-25 ENCOUNTER — Ambulatory Visit (INDEPENDENT_AMBULATORY_CARE_PROVIDER_SITE_OTHER): Payer: Medicare Other | Admitting: Family Medicine

## 2015-07-25 DIAGNOSIS — Z23 Encounter for immunization: Secondary | ICD-10-CM | POA: Diagnosis not present

## 2015-08-27 ENCOUNTER — Ambulatory Visit (INDEPENDENT_AMBULATORY_CARE_PROVIDER_SITE_OTHER): Payer: Medicare Other | Admitting: Family Medicine

## 2015-08-27 ENCOUNTER — Encounter: Payer: Self-pay | Admitting: Family Medicine

## 2015-08-27 VITALS — BP 130/72 | HR 80 | Temp 98.3°F | Resp 18 | Ht 67.0 in | Wt 215.0 lb

## 2015-08-27 DIAGNOSIS — M25562 Pain in left knee: Secondary | ICD-10-CM | POA: Diagnosis not present

## 2015-08-27 NOTE — Progress Notes (Signed)
Subjective:    Patient ID: Larry Butler, male    DOB: [birth year], 74 y.o.   MRN: [id number]  HPI 06/13/15 Patient presents with several months of left knee pain. Recently it has worsened while he has been renovating his bathroom. He has severe pain over the left medial joint line. The pain seems to be centered posteriorly. On examination today, he has pain with varus and valgus stress on the there is no laxity. He also has a positive Apley grind concerning for a meniscal tear. He also has a slight joint effusion but no erythema.  At that time, my plan was: I suspect the patient has a meniscal tear. After obtaining informed consent, I injected the left knee with a mixture of 2 mL of lidocaine, 2 mL of Marcaine, and 2 mL of 40 mg per mL Kenalog. The patient tolerated procedure well without complication. If pain worsens or does not improve, I would next proceed with an MRI of the left knee.  08/27/15 Had been doing great for 2 months.  However recently he has been climbing up and down ladders working on his home and he reaggravated his left knee. The pain is located over the medial joint line. He has a positive Apley grind. He has pain with full flexion. He also has pain in his MCL was checked with valgus stress.   Past Medical History  Diagnosis Date  . Asthma   . COPD (chronic obstructive pulmonary disease)   . GERD (gastroesophageal reflux disease)   . Chronic kidney disease    Past Surgical History  Procedure Laterality Date  . Appendectomy    . Tonsilectomy/adenoidectomy with myringotomy    . Back surgery      fusion 1980's  . Prostate surgery     Current Outpatient Prescriptions on File Prior to Visit  Medication Sig Dispense Refill  . albuterol (PROVENTIL HFA;VENTOLIN HFA) 108 (90 BASE) MCG/ACT inhaler Inhale 2 puffs into the lungs every 6 (six) hours as needed for wheezing or shortness of breath. 1 Inhaler 5  . allopurinol (ZYLOPRIM) 300 MG tablet Take 1 tablet (300 mg total)  by mouth daily. (Patient taking differently: Take 150 mg by mouth daily. ) 30 tablet 5  . amLODipine (NORVASC) 2.5 MG tablet Take 1 tablet (2.5 mg total) by mouth every evening. 30 tablet 6  . aspirin EC 81 MG tablet Take 81 mg by mouth daily.    Marland Kitchen esomeprazole (NEXIUM) 40 MG capsule Take 1 capsule (40 mg total) by mouth daily as needed. 30 capsule 11  . fluticasone (FLONASE) 50 MCG/ACT nasal spray Place 2 sprays into both nostrils daily as needed.     . hydroxychloroquine (PLAQUENIL) 200 MG tablet TAKE 1 TABLET(200 MG) BY MOUTH DAILY 90 tablet 1  . Multiple Vitamins-Minerals (CENTRUM SILVER PO) Take 1 tablet by mouth daily.    Marland Kitchen neomycin-polymyxin-hydrocortisone (CORTISPORIN) otic solution INSTILL 4 DROPS IN RIGHT EAR FOUR TIMES DAILY 10 mL 0   No current facility-administered medications on file prior to visit.   No Known Allergies Social History   Social History  . Marital Status: Married    Spouse Name: N/A  . Number of Children: N/A  . Years of Education: N/A   Occupational History  . Not on file.   Social History Main Topics  . Smoking status: Former Smoker    Quit date: 09/03/1977  . Smokeless tobacco: Never Used  . Alcohol Use: No  . Drug Use: No  . Sexual Activity:  Yes   Other Topics Concern  . Not on file   Social History Narrative   One kind of adopted daughter.        Review of Systems  All other systems reviewed and are negative.      Objective:   Physical Exam  Cardiovascular: Normal rate, regular rhythm and normal heart sounds.   Pulmonary/Chest: Effort normal and breath sounds normal.  Musculoskeletal:       Left knee: He exhibits decreased range of motion, swelling, effusion and abnormal meniscus. He exhibits no LCL laxity and no MCL laxity. Tenderness found. Medial joint line and MCL tenderness noted.  Vitals reviewed.         Assessment & Plan:  Left medial knee pain  I'm concerned the patient may have a medial meniscal tear now and also  an MCL strain. I recommended rest, elevation, ice, and compression.  For the next 2-3 months I want him to take it easy and avoid strenuous activity around his house.  Using sterile technique, I injected the left knee with a mixture of 2 mL of lidocaine, 2 mL of Marcaine, and 2 mL of 40 mg per mL Kenalog. If the patient does not improve over the next week, I would like to proceed with an MRI of the left knee and orthopedics consult

## 2015-10-11 ENCOUNTER — Other Ambulatory Visit: Payer: Self-pay | Admitting: Cardiology

## 2015-10-11 NOTE — Telephone Encounter (Signed)
Rx request sent to pharmacy.  

## 2015-11-20 DIAGNOSIS — H524 Presbyopia: Secondary | ICD-10-CM | POA: Diagnosis not present

## 2015-11-29 ENCOUNTER — Other Ambulatory Visit: Payer: Self-pay | Admitting: Family Medicine

## 2015-11-29 NOTE — Telephone Encounter (Signed)
Patient NTBS.   Refill refused.

## 2015-12-02 ENCOUNTER — Other Ambulatory Visit: Payer: Self-pay | Admitting: Family Medicine

## 2016-01-14 ENCOUNTER — Ambulatory Visit (INDEPENDENT_AMBULATORY_CARE_PROVIDER_SITE_OTHER): Payer: Medicare Other | Admitting: Family Medicine

## 2016-01-14 ENCOUNTER — Encounter: Payer: Self-pay | Admitting: Family Medicine

## 2016-01-14 VITALS — BP 132/68 | HR 98 | Temp 98.5°F | Resp 20 | Ht 67.0 in | Wt 212.0 lb

## 2016-01-14 DIAGNOSIS — S83282A Other tear of lateral meniscus, current injury, left knee, initial encounter: Secondary | ICD-10-CM | POA: Diagnosis not present

## 2016-01-14 DIAGNOSIS — M25562 Pain in left knee: Secondary | ICD-10-CM

## 2016-01-14 DIAGNOSIS — J4521 Mild intermittent asthma with (acute) exacerbation: Secondary | ICD-10-CM

## 2016-01-14 MED ORDER — PREDNISONE 20 MG PO TABS: ORAL_TABLET | ORAL | Status: DC

## 2016-01-14 MED ORDER — AZITHROMYCIN 250 MG PO TABS: ORAL_TABLET | ORAL | Status: DC

## 2016-01-14 NOTE — Progress Notes (Signed)
Subjective:    Patient ID: Larry Butler, male    DOB: May 24, 1941, 75 y.o.   MRN: [id number]  HPI 06/13/15 Patient presents with several months of left knee pain. Recently it has worsened while he has been renovating his bathroom. He has severe pain over the left medial joint line. The pain seems to be centered posteriorly. On examination today, he has pain with varus and valgus stress on the there is no laxity. He also has a positive Apley grind concerning for a meniscal tear. He also has a slight joint effusion but no erythema.  At that time, my plan was: I suspect the patient has a meniscal tear. After obtaining informed consent, I injected the left knee with a mixture of 2 mL of lidocaine, 2 mL of Marcaine, and 2 mL of 40 mg per mL Kenalog. The patient tolerated procedure well without complication. If pain worsens or does not improve, I would next proceed with an MRI of the left knee.  08/27/15 Had been doing great for 2 months.  However recently he has been climbing up and down ladders working on his home and he reaggravated his left knee. The pain is located over the medial joint line. He has a positive Apley grind. He has pain with full flexion. He also has pain in his MCL was checked with valgus stress.  At that time, my plan was: I'm concerned the patient may have a medial meniscal tear now and also an MCL strain. I recommended rest, elevation, ice, and compression.  For the next 2-3 months I want him to take it easy and avoid strenuous activity around his house.  Using sterile technique, I injected the left knee with a mixture of 2 mL of lidocaine, 2 mL of Marcaine, and 2 mL of 40 mg per mL Kenalog. If the patient does not improve over the next week, I would like to proceed with an MRI of the left knee and orthopedics consult  01/14/16 His knee pain improved for approximately 2 months after the previous injection however it has returned and it is just as severe as it was before. The pain  is primarily in his left knee over the posterior medial joint line. He has a positive Apley grind. He has tremendous pain going up and down steps. He has some mild tenderness to palpation over the joint lines bilaterally. He is also developing pain in his right knee. The pain has been present for more than 4 weeks. It is primarily located over the medial joint line. He is also been very sick over the last 3-4 days. He is wheezing, he has chest congestion, he has a cough productive of purulent sputum, he has subjective fevers. On examination he is wheezing with diminished breath sounds bilaterally and bilateral rails Past Medical History  Diagnosis Date  . Asthma   . COPD (chronic obstructive pulmonary disease) ([place])   . GERD (gastroesophageal reflux disease)   . Chronic kidney disease    Past Surgical History  Procedure Laterality Date  . Appendectomy    . Tonsilectomy/adenoidectomy with myringotomy    . Back surgery      fusion 1980's  . Prostate surgery     Current Outpatient Prescriptions on File Prior to Visit  Medication Sig Dispense Refill  . albuterol (PROVENTIL HFA;VENTOLIN HFA) 108 (90 BASE) MCG/ACT inhaler Inhale 2 puffs into the lungs every 6 (six) hours as needed for wheezing or shortness of breath. 1 Inhaler 5  . allopurinol (  ZYLOPRIM) 300 MG tablet TAKE 1 TABLET(300 MG) BY MOUTH DAILY 30 tablet 5  . amLODipine (NORVASC) 2.5 MG tablet TAKE 1 TABLET(2.5 MG) BY MOUTH EVERY EVENING 30 tablet 3  . aspirin EC 81 MG tablet Take 81 mg by mouth daily.    Marland Kitchen esomeprazole (NEXIUM) 40 MG capsule Take 1 capsule (40 mg total) by mouth daily as needed. 30 capsule 11  . fluticasone (FLONASE) 50 MCG/ACT nasal spray Place 2 sprays into both nostrils daily as needed.     . hydroxychloroquine (PLAQUENIL) 200 MG tablet TAKE 1 TABLET(200 MG) BY MOUTH DAILY 90 tablet 1  . Multiple Vitamins-Minerals (CENTRUM SILVER PO) Take 1 tablet by mouth daily.    Marland Kitchen neomycin-polymyxin-hydrocortisone (CORTISPORIN)  otic solution INSTILL 4 DROPS IN RIGHT EAR FOUR TIMES DAILY 10 mL 0   No current facility-administered medications on file prior to visit.   No Known Allergies Social History   Social History  . Marital Status: Married    Spouse Name: N/A  . Number of Children: N/A  . Years of Education: N/A   Occupational History  . Not on file.   Social History Main Topics  . Smoking status: Former Smoker    Quit date: 09/03/1977  . Smokeless tobacco: Never Used  . Alcohol Use: No  . Drug Use: No  . Sexual Activity: Yes   Other Topics Concern  . Not on file   Social History Narrative   One kind of adopted daughter.        Review of Systems  All other systems reviewed and are negative.      Objective:   Physical Exam  Cardiovascular: Normal rate, regular rhythm and normal heart sounds.   Pulmonary/Chest: Effort normal. He has wheezes. He has rales.  Musculoskeletal:       Left knee: He exhibits decreased range of motion, swelling, effusion and abnormal meniscus. He exhibits no LCL laxity and no MCL laxity. Tenderness found. Medial joint line and MCL tenderness noted.  Vitals reviewed.         Assessment & Plan:  Asthmatic bronchitis, mild intermittent, with acute exacerbation - Plan: predniSONE (DELTASONE) 20 MG tablet, azithromycin (ZITHROMAX) 250 MG tablet  Left medial knee pain - Plan: MR Knee Left  Wo Contrast  Lateral meniscal tear, left, initial encounter - Plan: MR Knee Left  Wo Contrast  Patient has asthmatic bronchitis. Begin prednisone taper pack. Use albuterol 2 puffs inhaled every 6 hours as needed. Treat bronchitis with a Z-Pak. I believe the patient has a meniscal tear in his left knee and also underlying arthritis. I will obtain an MRI of the need to characterize further and then consult orthopedics for definitive management. He also has arthritis and possibly meniscal tear in his right knee. We will try the prednisone first for conservative therapy and see if  pain improves.

## 2016-01-23 DIAGNOSIS — L570 Actinic keratosis: Secondary | ICD-10-CM | POA: Diagnosis not present

## 2016-01-23 DIAGNOSIS — L821 Other seborrheic keratosis: Secondary | ICD-10-CM | POA: Diagnosis not present

## 2016-01-23 DIAGNOSIS — L820 Inflamed seborrheic keratosis: Secondary | ICD-10-CM | POA: Diagnosis not present

## 2016-01-24 ENCOUNTER — Ambulatory Visit
Admission: RE | Admit: 2016-01-24 | Discharge: 2016-01-24 | Disposition: A | Discharge status: Home / Self Care | Payer: Medicare Other | Source: Ambulatory Visit | Attending: Family Medicine | Admitting: Family Medicine

## 2016-01-24 ENCOUNTER — Other Ambulatory Visit: Payer: Self-pay | Admitting: Family Medicine

## 2016-01-24 DIAGNOSIS — Z01818 Encounter for other preprocedural examination: Secondary | ICD-10-CM | POA: Diagnosis not present

## 2016-01-24 DIAGNOSIS — S83242A Other tear of medial meniscus, current injury, left knee, initial encounter: Secondary | ICD-10-CM | POA: Diagnosis not present

## 2016-01-24 DIAGNOSIS — Z77018 Contact with and (suspected) exposure to other hazardous metals: Secondary | ICD-10-CM

## 2016-01-24 DIAGNOSIS — M25562 Pain in left knee: Secondary | ICD-10-CM

## 2016-01-24 DIAGNOSIS — S83282A Other tear of lateral meniscus, current injury, left knee, initial encounter: Secondary | ICD-10-CM

## 2016-01-29 ENCOUNTER — Other Ambulatory Visit: Payer: Self-pay | Admitting: Family Medicine

## 2016-01-29 DIAGNOSIS — S83207A Unspecified tear of unspecified meniscus, current injury, left knee, initial encounter: Secondary | ICD-10-CM

## 2016-02-01 ENCOUNTER — Other Ambulatory Visit: Payer: Self-pay | Admitting: Cardiology

## 2016-02-01 NOTE — Telephone Encounter (Signed)
Rx request sent to pharmacy.  

## 2016-02-04 DIAGNOSIS — M170 Bilateral primary osteoarthritis of knee: Secondary | ICD-10-CM | POA: Diagnosis not present

## 2016-02-04 DIAGNOSIS — M1712 Unilateral primary osteoarthritis, left knee: Secondary | ICD-10-CM | POA: Diagnosis not present

## 2016-02-04 DIAGNOSIS — M1711 Unilateral primary osteoarthritis, right knee: Secondary | ICD-10-CM | POA: Diagnosis not present

## 2016-02-06 NOTE — Progress Notes (Signed)
Cardiology Office Note   Date:  02/07/2016   ID:  Larry Butler, DOB 03-28-1941, MRN [id number]  PCP:  Odette Fraction, MD  Cardiologist:   Minus Breeding, MD   No chief complaint on file.   I saw last year and started norvasc since.  Nothing else in the chart    History of Present Illness: CASS RILL is a 75 y.o. male who presents for follow-up of coronary disease.  He moved to this area with his wife last year.  He's been followed for years by a cardiologist but has not had any severe problems as far as I can tell.   Acardiac catheterization in 2008 with apparently nonobstructive disease. A 2013 stress test demonstrated some mild mid inferior ischemia but this was apparently managed medically. His last stress test was in February 2015.   Echocardiogram that I reviewed 2013 demonstrated well-preserved ejection fraction. There was some mild aortic stenosis.  He does quite well. He is very active.. The patient denies any new symptoms such as chest discomfort, neck or arm discomfort. There has been no new PND or orthopnea. There have been no reported palpitations, presyncope or syncope.  He does have some shortness of breath and he is concerned because he has exposure to assess this and was getting chest x-rays routinely.   Past Medical History  Diagnosis Date  . Asthma   . COPD (chronic obstructive pulmonary disease) ([place])   . GERD (gastroesophageal reflux disease)   . Chronic kidney disease     Past Surgical History  Procedure Laterality Date  . Appendectomy    . Tonsilectomy/adenoidectomy with myringotomy    . Back surgery      fusion 1980's  . Prostate surgery       Current Outpatient Prescriptions  Medication Sig Dispense Refill  . albuterol (PROVENTIL HFA;VENTOLIN HFA) 108 (90 BASE) MCG/ACT inhaler Inhale 2 puffs into the lungs every 6 (six) hours as needed for wheezing or shortness of breath. 1 Inhaler 5  . allopurinol (ZYLOPRIM) 300 MG tablet TAKE 1  TABLET(300 MG) BY MOUTH DAILY 30 tablet 5  . amLODipine (NORVASC) 2.5 MG tablet TAKE 1 TABLET(2.5 MG) BY MOUTH EVERY EVENING 30 tablet 0  . aspirin EC 81 MG tablet Take 81 mg by mouth daily.    Marland Kitchen azithromycin (ZITHROMAX) 250 MG tablet 2 tabs poqday1, 1 tab poqday 2-5 6 tablet 0  . esomeprazole (NEXIUM) 40 MG capsule Take 1 capsule (40 mg total) by mouth daily as needed. 30 capsule 11  . fluticasone (FLONASE) 50 MCG/ACT nasal spray Place 2 sprays into both nostrils daily as needed.     . hydroxychloroquine (PLAQUENIL) 200 MG tablet TAKE 1 TABLET(200 MG) BY MOUTH DAILY 90 tablet 1  . Multiple Vitamins-Minerals (CENTRUM SILVER PO) Take 1 tablet by mouth daily.    Marland Kitchen neomycin-polymyxin-hydrocortisone (CORTISPORIN) otic solution INSTILL 4 DROPS IN RIGHT EAR FOUR TIMES DAILY 10 mL 0   No current facility-administered medications for this visit.    Allergies:   Review of patient's allergies indicates no known allergies.    Social History:  The patient  reports that he quit smoking about 38 years ago. He has never used smokeless tobacco. He reports that he does not drink alcohol or use illicit drugs.   Family History:  The patient's family history includes COPD in his brother; Cancer in his brother and mother; Cancer (age of onset: 70) in his father; Diabetes in his brother, brother, brother, brother, and sister;  Hearing loss in his brother, brother, and brother; Heart disease in his brother; Heart disease (age of onset: 16) in his brother; Heart disease (age of onset: 72) in his brother; Stroke in his mother.   Of note he has many siblings and it seems like most of his brothers have some heart disease though we don't know all the details.   ROS:  Please see the history of present illness.   Otherwise, review of systems are positive for temporal headaches, reflux.   All other systems are reviewed and negative.    PHYSICAL EXAM: VS:  BP 138/88 mmHg  Pulse 73  Ht 5\' 6"  (1.676 m)  Wt 216 lb 6.4 oz  (98.158 kg)  BMI 34.94 kg/m2  SpO2 93% , BMI Body mass index is 34.94 kg/(m^2). GENERAL:  Well appearing HEENT:  Pupils equal round and reactive, fundi not visualized, oral mucosa unremarkable NECK:  No jugular venous distention, waveform within normal limits, carotid upstroke brisk and symmetric, no bruits, no thyromegaly LYMPHATICS:  No cervical, inguinal adenopathy LUNGS:  Clear to auscultation bilaterally BACK:  No CVA tenderness CHEST:  Unremarkable HEART:  PMI not displaced or sustained,S1 and S2 within normal limits, no S3, no S4, no clicks, no rubs, no murmurs ABD:  Flat, positive bowel sounds normal in frequency in pitch, no bruits, no rebound, no guarding, no midline pulsatile mass, no hepatomegaly, no splenomegaly EXT:  2 plus pulses throughout, no edema, no cyanosis no clubbing SKIN:  No rashes no nodules NEURO:  Cranial nerves II through XII grossly intact, motor grossly intact throughout PSYCH:  Cognitively intact, oriented to person place and time    EKG:  EKG is ordered today. The ekg ordered today demonstrates sinus rhythm, rate 75, axis within normal limits, intervals within normal limits, no acute ST-T wave changes.   Recent Labs: 06/05/2015: ALT 20; Hemoglobin 15.0; Platelets 191 07/05/2015: BUN 16; Creat 1.33*; Potassium 4.2; Sodium 138    Lipid Panel    Component Value Date/Time   CHOL 182 06/05/2015 0837   TRIG 82 06/05/2015 0837   HDL 47 06/05/2015 0837   CHOLHDL 3.9 06/05/2015 0837   VLDL 16 06/05/2015 0837   LDLCALC 119 06/05/2015 0837      Wt Readings from Last 3 Encounters:  02/07/16 216 lb 6.4 oz (98.158 kg)  01/14/16 212 lb (96.163 kg)  08/27/15 215 lb (97.523 kg)      Other studies Reviewed: Additional studies/ records that were reviewed today include: Extensive outside records. Review of the above records demonstrates:  Please see elsewhere in the note.     ASSESSMENT AND PLAN:  CAD: This is mild and nonobstructive. No further testing  needs to be done.  We did discuss at length primary risk reduction to include exercise.  HTN:  The blood pressure is very mildly elevated. However, this is unusual. No change in therapy is indicated.  CAROTID STENOSIS:  He's had some very mild plaque.   No follow up is indicated at this time   SOB:  I will order a CXR.    Current medicines are reviewed at length with the patient today.  The patient does not have concerns regarding medicines.  The following changes have been made:  no change  Labs/ tests ordered today include: CXR   Disposition:   FU with me in one year.    Signed, Minus Breeding, MD  02/07/2016 10:23 AM    [facility]

## 2016-02-07 ENCOUNTER — Ambulatory Visit (INDEPENDENT_AMBULATORY_CARE_PROVIDER_SITE_OTHER): Payer: Medicare Other | Admitting: Cardiology

## 2016-02-07 ENCOUNTER — Encounter: Payer: Self-pay | Admitting: Cardiology

## 2016-02-07 ENCOUNTER — Ambulatory Visit
Admission: RE | Admit: 2016-02-07 | Discharge: 2016-02-07 | Disposition: A | Discharge status: Home / Self Care | Payer: Medicare Other | Source: Ambulatory Visit | Attending: Cardiology | Admitting: Cardiology

## 2016-02-07 VITALS — BP 138/88 | HR 73 | Ht 66.0 in | Wt 216.4 lb

## 2016-02-07 DIAGNOSIS — I2510 Atherosclerotic heart disease of native coronary artery without angina pectoris: Secondary | ICD-10-CM | POA: Diagnosis not present

## 2016-02-07 DIAGNOSIS — R0602 Shortness of breath: Secondary | ICD-10-CM

## 2016-02-07 MED ORDER — AMLODIPINE BESYLATE 2.5 MG PO TABS: ORAL_TABLET | ORAL | Status: DC

## 2016-02-07 NOTE — Patient Instructions (Addendum)
NO CHANGE IN MEDICATIONS  PLEASE GO TO [place]- THE FIRST LEVEL- NO PAPERS ARE NEEDED.A chest x-ray takes a picture of the organs and structures inside the chest, including the heart, lungs, and blood vessels. This test can show several things, including, whether the heart is enlarges; whether fluid is building up in the lungs; and whether pacemaker / defibrillator leads are still in place.   Your physician wants you to follow-up in [place]. You will receive a reminder letter in the mail two months in advance. If you don't receive a letter, please call our office to schedule the follow-up appointment.  If you need a refill on your cardiac medications before your next appointment, please call your pharmacy.

## 2016-03-04 DIAGNOSIS — Z0000 Encounter for general adult medical examination without abnormal findings: Secondary | ICD-10-CM | POA: Diagnosis not present

## 2016-03-04 DIAGNOSIS — N400 Enlarged prostate without lower urinary tract symptoms: Secondary | ICD-10-CM | POA: Diagnosis not present

## 2016-03-06 DIAGNOSIS — M170 Bilateral primary osteoarthritis of knee: Secondary | ICD-10-CM | POA: Diagnosis not present

## 2016-03-19 DIAGNOSIS — M1711 Unilateral primary osteoarthritis, right knee: Secondary | ICD-10-CM | POA: Diagnosis not present

## 2016-03-25 DIAGNOSIS — M1711 Unilateral primary osteoarthritis, right knee: Secondary | ICD-10-CM | POA: Diagnosis not present

## 2016-03-25 DIAGNOSIS — M23311 Other meniscus derangements, anterior horn of medial meniscus, right knee: Secondary | ICD-10-CM | POA: Diagnosis not present

## 2016-03-27 ENCOUNTER — Ambulatory Visit: Payer: Self-pay | Admitting: Orthopedic Surgery

## 2016-03-27 NOTE — Progress Notes (Signed)
Preoperative surgical orders have been place into the Epic hospital system for Larry Butler on 03/27/2016, 1:07 PM  by Mickel Crow for surgery on 04-08-16.  Preop Knee Scope orders including IV Tylenol and IV Decadron as long as there are no contraindications to the above medications. Arlee Muslim, PA-C

## 2016-03-28 NOTE — Patient Instructions (Addendum)
Larry Butler  03/28/2016   Your procedure is scheduled on: 04/08/16   TUESDAY  Report to [facility]  Entrance take [facility]  elevators to 3rd floor to  [place] at 1200 PM (noon)  Call this number if you have problems the morning of surgery [phone]   Remember: ONLY 1 PERSON MAY GO WITH YOU TO SHORT STAY TO GET  READY MORNING OF [place].  Do not eat food :After Midnight. Monday NIGHT----MAY HAVE CLEAR LIQUIDS Tuesday MORNING UNTIL 0800AM-- THEN NOTHING BY MOUTH   PLEASE BRING INHALER WITH YOU  Take these medicines the morning of surgery with A SIP OF WATER: Nexium, Allupirinol May use albuterol inhaler, flonase nasal spray, or refresh drops if needed                               You may not have any metal on your body including hair pins and              piercings  Do not wear jewelry, make-up, lotions, powders or perfumes, deodorant             Do not wear nail polish.  Do not shave  48 hours prior to surgery.              Men may shave face and neck.   Do not bring valuables to the hospital. [place].  Contacts, dentures or bridgework may not be worn into surgery.  Leave suitcase in the car. After surgery it may be brought to your room.     Patients discharged the day of surgery will not be allowed to drive home.  Name and phone number of your driver: wife  Gregary Signs  Special Instructions: N/A              Please read over the following fact sheets you were given: _____________________________________________________________________             [facility] - Preparing for Surgery Before surgery, you can play an important role.  Because skin is not sterile, your skin needs to be as free of germs as possible.  You can reduce the number of germs on your skin by washing with CHG (chlorahexidine gluconate) soap before surgery.  CHG is an antiseptic cleaner which kills germs and bonds with  the skin to continue killing germs even after washing. Please DO NOT use if you have an allergy to CHG or antibacterial soaps.  If your skin becomes reddened/irritated stop using the CHG and inform your nurse when you arrive at Short Stay. Do not shave (including legs and underarms) for at least 48 hours prior to the first CHG shower.  You may shave your face/neck. Please follow these instructions carefully:  1.  Shower with CHG Soap the night before surgery and the  morning of Surgery.  2.  If you choose to wash your hair, wash your hair first as usual with your  normal  shampoo.  3.  After you shampoo, rinse your hair and body thoroughly to remove the  shampoo.                           4.  Use CHG as you would any other liquid soap.  You can apply chg directly  to the skin and wash                       Gently with a scrungie or clean washcloth.  5.  Apply the CHG Soap to your body ONLY FROM THE NECK DOWN.   Do not use on face/ open                           Wound or open sores. Avoid contact with eyes, ears mouth and genitals (private parts).                       Wash face,  Genitals (private parts) with your normal soap.             6.  Wash thoroughly, paying special attention to the area where your surgery  will be performed.  7.  Thoroughly rinse your body with warm water from the neck down.  8.  DO NOT shower/wash with your normal soap after using and rinsing off  the CHG Soap.                9.  Pat yourself dry with a clean towel.            10.  Wear clean pajamas.            11.  Place clean sheets on your bed the night of your first shower and do not  sleep with pets. Day of Surgery : Do not apply any lotions/deodorants the morning of surgery.  Please wear clean clothes to the hospital/surgery center.  FAILURE TO FOLLOW THESE INSTRUCTIONS MAY RESULT IN THE CANCELLATION OF YOUR SURGERY PATIENT SIGNATURE_________________________________  NURSE  SIGNATURE__________________________________  ________________________________________________________________________    CLEAR LIQUID DIET   Foods Allowed                                                                     Foods Excluded  Coffee and tea, regular and decaf                             liquids that you cannot  Plain Jell-O in any flavor                                             see through such as: Fruit ices (not with fruit pulp)                                     milk, soups, orange juice  Iced Popsicles                                    All solid food Carbonated beverages, regular and diet  Cranberry, grape and apple juices Sports drinks like Gatorade Lightly seasoned clear broth or consume(fat free) Sugar, honey syrup  Sample Menu Breakfast                                Lunch                                     Supper Cranberry juice                    Beef broth                            Chicken broth Jell-O                                     Grape juice                           Apple juice Coffee or tea                        Jell-O                                      Popsicle                                                Coffee or tea                        Coffee or tea  _____________________________________________________________________    Incentive Spirometer  An incentive spirometer is a tool that can help keep your lungs clear and active. This tool measures how well you are filling your lungs with each breath. Taking long deep breaths may help reverse or decrease the chance of developing breathing (pulmonary) problems (especially infection) following:  A long period of time when you are unable to move or be active. BEFORE THE PROCEDURE   If the spirometer includes an indicator to show your best effort, your nurse or respiratory therapist will set it to a desired goal.  If possible, sit up straight or lean  slightly forward. Try not to slouch.  Hold the incentive spirometer in an upright position. INSTRUCTIONS FOR USE  1. Sit on the edge of your bed if possible, or sit up as far as you can in bed or on a chair. 2. Hold the incentive spirometer in an upright position. 3. Breathe out normally. 4. Place the mouthpiece in your mouth and seal your lips tightly around it. 5. Breathe in slowly and as deeply as possible, raising the piston or the ball toward the top of the column. 6. Hold your breath for 3-5 seconds or for as long as possible. Allow the piston or ball to fall to the bottom of the column. 7. Remove the mouthpiece from your mouth and breathe out normally. 8. Rest for a few seconds and repeat Steps 1 through 7 at  least 10 times every 1-2 hours when you are awake. Take your time and take a few normal breaths between deep breaths. 9. The spirometer may include an indicator to show your best effort. Use the indicator as a goal to work toward during each repetition. 10. After each set of 10 deep breaths, practice coughing to be sure your lungs are clear. If you have an incision (the cut made at the time of surgery), support your incision when coughing by placing a pillow or rolled up towels firmly against it. Once you are able to get out of bed, walk around indoors and cough well. You may stop using the incentive spirometer when instructed by your caregiver.  RISKS AND COMPLICATIONS  Take your time so you do not get dizzy or light-headed.  If you are in pain, you may need to take or ask for pain medication before doing incentive spirometry. It is harder to take a deep breath if you are having pain. AFTER USE  Rest and breathe slowly and easily.  It can be helpful to keep track of a log of your progress. Your caregiver can provide you with a simple table to help with this. If you are using the spirometer at home, follow these instructions: [place] IF:   You are having difficultly  using the spirometer.  You have trouble using the spirometer as often as instructed.  Your pain medication is not giving enough relief while using the spirometer.  You develop fever of 100.5 F (38.1 C) or higher. SEEK IMMEDIATE MEDICAL CARE IF:   You cough up bloody sputum that had not been present before.  You develop fever of 102 F (38.9 C) or greater.  You develop worsening pain at or near the incision site. MAKE SURE YOU:   Understand these instructions.  Will watch your condition.  Will get help right away if you are not doing well or get worse. Document Released: 03/02/2007 Document Revised: 01/12/2012 Document Reviewed: 05/03/2007 [facility] Patient Information 2014 [place], [place].   ________________________________________________________________________

## 2016-03-28 NOTE — Progress Notes (Signed)
lov dr hocherin with ekg 4/17 epic, stress with eccho 2013 Chest 4/17 epic

## 2016-04-02 ENCOUNTER — Encounter (HOSPITAL_COMMUNITY)
Admission: RE | Admit: 2016-04-02 | Discharge: 2016-04-02 | Disposition: A | Discharge status: Home / Self Care | Payer: Medicare Other | Source: Ambulatory Visit | Attending: Orthopedic Surgery | Admitting: Orthopedic Surgery

## 2016-04-02 ENCOUNTER — Encounter (HOSPITAL_COMMUNITY): Payer: Self-pay

## 2016-04-02 DIAGNOSIS — X58XXXA Exposure to other specified factors, initial encounter: Secondary | ICD-10-CM | POA: Insufficient documentation

## 2016-04-02 DIAGNOSIS — S83241A Other tear of medial meniscus, current injury, right knee, initial encounter: Secondary | ICD-10-CM | POA: Diagnosis not present

## 2016-04-02 DIAGNOSIS — Z01812 Encounter for preprocedural laboratory examination: Secondary | ICD-10-CM | POA: Diagnosis not present

## 2016-04-02 HISTORY — DX: Unspecified hearing loss, unspecified ear: H91.90

## 2016-04-02 HISTORY — DX: Reserved for inherently not codable concepts without codable children: IMO0001

## 2016-04-02 HISTORY — DX: Other specified postprocedural states: Z98.890

## 2016-04-02 HISTORY — DX: Nausea with vomiting, unspecified: R11.2

## 2016-04-02 HISTORY — DX: Gout, unspecified: M10.9

## 2016-04-02 HISTORY — DX: Unspecified osteoarthritis, unspecified site: M19.90

## 2016-04-02 LAB — CBC
HCT: 42.3 % (ref 39.0–52.0)
Hemoglobin: 14.6 g/dL (ref 13.0–17.0)
MCH: 29.1 pg (ref 26.0–34.0)
MCHC: 34.5 g/dL (ref 30.0–36.0)
MCV: 84.4 fL (ref 78.0–100.0)
PLATELETS: 180 10*3/uL (ref 150–400)
RBC: 5.01 MIL/uL (ref 4.22–5.81)
RDW: 13.9 % (ref 11.5–15.5)
WBC: 7 10*3/uL (ref 4.0–10.5)

## 2016-04-02 LAB — SURGICAL PCR SCREEN
MRSA, PCR: NEGATIVE
Staphylococcus aureus: NEGATIVE

## 2016-04-02 LAB — BASIC METABOLIC PANEL
Anion gap: 6 (ref 5–15)
BUN: 22 mg/dL — AB (ref 6–20)
CALCIUM: 9 mg/dL (ref 8.9–10.3)
CHLORIDE: 107 mmol/L (ref 101–111)
CO2: 28 mmol/L (ref 22–32)
CREATININE: 1.3 mg/dL — AB (ref 0.61–1.24)
GFR calc Af Amer: >60 mL/min (ref >60)
GFR calc non Af Amer: 53 mL/min — ABNORMAL LOW (ref >60)
GLUCOSE: 105 mg/dL — AB (ref 65–99)
Potassium: 3.9 mmol/L (ref 3.5–5.1)
Sodium: 141 mmol/L (ref 135–145)

## 2016-04-02 NOTE — Progress Notes (Signed)
   04/02/16 1034  OBSTRUCTIVE SLEEP APNEA  Have you ever been diagnosed with sleep apnea through a sleep study? No  Do you snore loudly (loud enough to be heard through closed doors)?  1  Do you often feel tired, fatigued, or sleepy during the daytime (such as falling asleep during driving or talking to someone)? 0  Has anyone observed you stop breathing during your sleep? 0  Do you have, or are you being treated for high blood pressure? 1  BMI more than 35 kg/m2? 0  Age > 50 (1-yes) 1  Neck circumference greater than:Male 16 inches or larger, Male 17inches or larger? 1  Male Gender (Yes=1) 1  Obstructive Sleep Apnea Score 5

## 2016-04-08 ENCOUNTER — Encounter (HOSPITAL_COMMUNITY)
Admission: RE | Disposition: A | Discharge status: Home / Self Care | Payer: Self-pay | Source: Ambulatory Visit | Attending: Orthopedic Surgery

## 2016-04-08 ENCOUNTER — Ambulatory Visit (HOSPITAL_COMMUNITY): Payer: Medicare Other | Admitting: Anesthesiology

## 2016-04-08 ENCOUNTER — Encounter (HOSPITAL_COMMUNITY): Payer: Self-pay | Admitting: *Deleted

## 2016-04-08 ENCOUNTER — Ambulatory Visit (HOSPITAL_COMMUNITY)
Admission: RE | Admit: 2016-04-08 | Discharge: 2016-04-08 | Disposition: A | Discharge status: Home / Self Care | Payer: Medicare Other | Source: Ambulatory Visit | Attending: Orthopedic Surgery | Admitting: Orthopedic Surgery

## 2016-04-08 DIAGNOSIS — M1990 Unspecified osteoarthritis, unspecified site: Secondary | ICD-10-CM | POA: Insufficient documentation

## 2016-04-08 DIAGNOSIS — X58XXXA Exposure to other specified factors, initial encounter: Secondary | ICD-10-CM | POA: Diagnosis not present

## 2016-04-08 DIAGNOSIS — S83241A Other tear of medial meniscus, current injury, right knee, initial encounter: Secondary | ICD-10-CM | POA: Insufficient documentation

## 2016-04-08 DIAGNOSIS — N189 Chronic kidney disease, unspecified: Secondary | ICD-10-CM | POA: Diagnosis not present

## 2016-04-08 DIAGNOSIS — Z7982 Long term (current) use of aspirin: Secondary | ICD-10-CM | POA: Insufficient documentation

## 2016-04-08 DIAGNOSIS — M25561 Pain in right knee: Secondary | ICD-10-CM | POA: Diagnosis present

## 2016-04-08 DIAGNOSIS — K219 Gastro-esophageal reflux disease without esophagitis: Secondary | ICD-10-CM | POA: Insufficient documentation

## 2016-04-08 DIAGNOSIS — Z7951 Long term (current) use of inhaled steroids: Secondary | ICD-10-CM | POA: Diagnosis not present

## 2016-04-08 DIAGNOSIS — M109 Gout, unspecified: Secondary | ICD-10-CM | POA: Diagnosis not present

## 2016-04-08 DIAGNOSIS — Z79899 Other long term (current) drug therapy: Secondary | ICD-10-CM | POA: Diagnosis not present

## 2016-04-08 DIAGNOSIS — M2241 Chondromalacia patellae, right knee: Secondary | ICD-10-CM | POA: Diagnosis not present

## 2016-04-08 DIAGNOSIS — J449 Chronic obstructive pulmonary disease, unspecified: Secondary | ICD-10-CM | POA: Diagnosis not present

## 2016-04-08 DIAGNOSIS — Z87891 Personal history of nicotine dependence: Secondary | ICD-10-CM | POA: Insufficient documentation

## 2016-04-08 DIAGNOSIS — M23321 Other meniscus derangements, posterior horn of medial meniscus, right knee: Secondary | ICD-10-CM | POA: Diagnosis not present

## 2016-04-08 DIAGNOSIS — S83249A Other tear of medial meniscus, current injury, unspecified knee, initial encounter: Secondary | ICD-10-CM | POA: Diagnosis present

## 2016-04-08 HISTORY — PX: KNEE ARTHROSCOPY: SHX127

## 2016-04-08 SURGERY — ARTHROSCOPY, KNEE
Anesthesia: General | Site: Knee | Laterality: Right

## 2016-04-08 MED ORDER — BUPIVACAINE-EPINEPHRINE 0.25% -1:200000 IJ SOLN
INTRAMUSCULAR | Status: DC | PRN
  Administered 2016-04-08: 20 mL

## 2016-04-08 MED ORDER — ALLOPURINOL 300 MG PO TABS: ORAL_TABLET | ORAL | Status: DC

## 2016-04-08 MED ORDER — BUPIVACAINE-EPINEPHRINE (PF) 0.25% -1:200000 IJ SOLN
INTRAMUSCULAR | Status: AC
  Filled 2016-04-08: qty 30

## 2016-04-08 MED ORDER — LIDOCAINE HCL (CARDIAC) 20 MG/ML IV SOLN
INTRAVENOUS | Status: AC
  Filled 2016-04-08: qty 5

## 2016-04-08 MED ORDER — ESOMEPRAZOLE MAGNESIUM 40 MG PO CPDR: 40.0000 mg | DELAYED_RELEASE_CAPSULE | Freq: Every day | ORAL | Status: DC

## 2016-04-08 MED ORDER — PROPOFOL 10 MG/ML IV BOLUS
INTRAVENOUS | Status: DC | PRN
  Administered 2016-04-08: 180 mg via INTRAVENOUS

## 2016-04-08 MED ORDER — ACETAMINOPHEN 10 MG/ML IV SOLN
1000.0000 mg | Freq: Once | INTRAVENOUS | Status: AC
  Administered 2016-04-08: 1000 mg via INTRAVENOUS

## 2016-04-08 MED ORDER — CEFAZOLIN SODIUM-DEXTROSE 2-4 GM/100ML-% IV SOLN
2.0000 g | INTRAVENOUS | Status: AC
  Administered 2016-04-08: 2 g via INTRAVENOUS
  Filled 2016-04-08: qty 100

## 2016-04-08 MED ORDER — SODIUM CHLORIDE 0.9 % IV SOLN
INTRAVENOUS | Status: DC
Start: 2016-04-08 — End: 2016-04-08

## 2016-04-08 MED ORDER — CEFAZOLIN SODIUM-DEXTROSE 2-4 GM/100ML-% IV SOLN
INTRAVENOUS | Status: AC
  Filled 2016-04-08: qty 100

## 2016-04-08 MED ORDER — ONDANSETRON HCL 4 MG/2ML IJ SOLN
INTRAMUSCULAR | Status: AC
  Filled 2016-04-08: qty 4

## 2016-04-08 MED ORDER — FENTANYL CITRATE (PF) 100 MCG/2ML IJ SOLN
INTRAMUSCULAR | Status: AC
  Filled 2016-04-08: qty 2

## 2016-04-08 MED ORDER — LIDOCAINE HCL (CARDIAC) 20 MG/ML IV SOLN
INTRAVENOUS | Status: DC | PRN
  Administered 2016-04-08: 60 mg via INTRAVENOUS

## 2016-04-08 MED ORDER — LACTATED RINGERS IR SOLN
Status: DC | PRN
  Administered 2016-04-08 (×2): 3000 mL

## 2016-04-08 MED ORDER — HYDROCODONE-ACETAMINOPHEN 5-325 MG PO TABS
1.0000 | ORAL_TABLET | Freq: Four times a day (QID) | ORAL | Status: DC | PRN
  Administered 2016-04-08: 1 via ORAL
  Filled 2016-04-08: qty 1

## 2016-04-08 MED ORDER — DEXAMETHASONE SODIUM PHOSPHATE 10 MG/ML IJ SOLN
10.0000 mg | Freq: Once | INTRAMUSCULAR | Status: AC
  Administered 2016-04-08: 10 mg via INTRAVENOUS

## 2016-04-08 MED ORDER — LACTATED RINGERS IV SOLN
INTRAVENOUS | Status: DC
  Administered 2016-04-08: 13:00:00 via INTRAVENOUS
  Administered 2016-04-08: 1000 mL via INTRAVENOUS

## 2016-04-08 MED ORDER — ONDANSETRON HCL 4 MG/2ML IJ SOLN
INTRAMUSCULAR | Status: AC
  Filled 2016-04-08: qty 2

## 2016-04-08 MED ORDER — HYDROCODONE-ACETAMINOPHEN 5-325 MG PO TABS: 1.0000 | ORAL_TABLET | ORAL | Status: DC | PRN

## 2016-04-08 MED ORDER — METHOCARBAMOL 500 MG PO TABS
500.0000 mg | ORAL_TABLET | Freq: Four times a day (QID) | ORAL | Status: DC
Start: 2016-04-08 — End: 2016-05-13

## 2016-04-08 MED ORDER — FENTANYL CITRATE (PF) 100 MCG/2ML IJ SOLN: 25.0000 ug | INTRAMUSCULAR | Status: DC | PRN

## 2016-04-08 MED ORDER — ACETAMINOPHEN 10 MG/ML IV SOLN
INTRAVENOUS | Status: AC
  Filled 2016-04-08: qty 100

## 2016-04-08 MED ORDER — AMLODIPINE BESYLATE 2.5 MG PO TABS: 2.5000 mg | ORAL_TABLET | Freq: Every day | ORAL | Status: DC

## 2016-04-08 MED ORDER — DEXAMETHASONE SODIUM PHOSPHATE 10 MG/ML IJ SOLN
INTRAMUSCULAR | Status: AC
  Filled 2016-04-08: qty 1

## 2016-04-08 MED ORDER — FENTANYL CITRATE (PF) 100 MCG/2ML IJ SOLN
INTRAMUSCULAR | Status: DC | PRN
Start: 2016-04-08 — End: 2016-04-08
  Administered 2016-04-08 (×4): 25 ug via INTRAVENOUS

## 2016-04-08 MED ORDER — ONDANSETRON HCL 4 MG/2ML IJ SOLN
INTRAMUSCULAR | Status: DC | PRN
  Administered 2016-04-08: 4 mg via INTRAVENOUS

## 2016-04-08 MED FILL — METHOCARBAMOL 500 MG TABLET: 500 | 7 days supply | Qty: 30 | Fill #0

## 2016-04-08 MED FILL — HYDROCODON-APAP 5-325: 5-325 | 3 days supply | Qty: 30 | Fill #0

## 2016-04-08 SURGICAL SUPPLY — 31 items
BANDAGE ACE 6X5 VEL STRL LF (GAUZE/BANDAGES/DRESSINGS) ×3 IMPLANT
BANDAGE ELASTIC 6 VELCRO ST LF (GAUZE/BANDAGES/DRESSINGS) ×3 IMPLANT
BLADE 4.2CUDA (BLADE) ×3 IMPLANT
COVER SURGICAL LIGHT HANDLE (MISCELLANEOUS) ×3 IMPLANT
CUFF TOURN SGL QUICK 34 (TOURNIQUET CUFF) ×2
CUFF TRNQT CYL 34X4X40X1 (TOURNIQUET CUFF) ×1 IMPLANT
DECANTER SPIKE VIAL GLASS SM (MISCELLANEOUS) ×3 IMPLANT
DRAPE U-SHAPE 47X51 STRL (DRAPES) ×3 IMPLANT
DRSG EMULSION OIL 3X3 NADH (GAUZE/BANDAGES/DRESSINGS) ×3 IMPLANT
DRSG PAD ABDOMINAL 8X10 ST (GAUZE/BANDAGES/DRESSINGS) ×3 IMPLANT
DURAPREP 26ML APPLICATOR (WOUND CARE) ×3 IMPLANT
GAUZE SPONGE 4X4 12PLY STRL (GAUZE/BANDAGES/DRESSINGS) ×3 IMPLANT
GLOVE BIO SURGEON STRL SZ8 (GLOVE) ×3 IMPLANT
GLOVE BIOGEL PI IND STRL 8 (GLOVE) ×1 IMPLANT
GLOVE BIOGEL PI INDICATOR 8 (GLOVE) ×2
GOWN STRL REUS W/TWL LRG LVL3 (GOWN DISPOSABLE) ×3 IMPLANT
KIT BASIN OR (CUSTOM PROCEDURE TRAY) ×3 IMPLANT
MANIFOLD NEPTUNE II (INSTRUMENTS) ×3 IMPLANT
MARKER SKIN DUAL TIP RULER LAB (MISCELLANEOUS) ×3 IMPLANT
PACK ARTHROSCOPY WL (CUSTOM PROCEDURE TRAY) ×3 IMPLANT
PACK ICE MAXI GEL EZY WRAP (MISCELLANEOUS) ×9 IMPLANT
PAD ABD 8X10 STRL (GAUZE/BANDAGES/DRESSINGS) ×3 IMPLANT
PADDING CAST COTTON 6X4 STRL (CAST SUPPLIES) ×3 IMPLANT
POSITIONER SURGICAL ARM (MISCELLANEOUS) ×3 IMPLANT
SET ARTHROSCOPY TUBING (MISCELLANEOUS) ×2
SET ARTHROSCOPY TUBING LN (MISCELLANEOUS) ×1 IMPLANT
SUT ETHILON 4 0 PS 2 18 (SUTURE) ×3 IMPLANT
TOWEL OR 17X26 10 PK STRL BLUE (TOWEL DISPOSABLE) ×3 IMPLANT
TUBING ARTHRO INFLOW-ONLY STRL (TUBING) IMPLANT
WAND HAND CNTRL MULTIVAC 90 (MISCELLANEOUS) ×3 IMPLANT
WRAP KNEE MAXI GEL POST OP (GAUZE/BANDAGES/DRESSINGS) ×3 IMPLANT

## 2016-04-08 NOTE — Discharge Instructions (Signed)
° °Dr. Frank Aluisio °Total Joint Specialist °[facility] °[address] °[place], [place] [address] °[phone] ° ° °Arthroscopic Procedure, Knee °An arthroscopic procedure can find what is wrong with your knee. °PROCEDURE °Arthroscopy is a surgical technique that allows your orthopedic surgeon to diagnose and treat your knee injury with accuracy. They will look into your knee through a small instrument. This is almost like a small (pencil sized) telescope. Because arthroscopy affects your knee less than open knee surgery, you can anticipate a more rapid recovery. Taking an active role by following your caregiver's instructions will help with rapid and complete recovery. Use crutches, rest, elevation, ice, and knee exercises as instructed. The length of recovery depends on various factors including type of injury, age, physical condition, medical conditions, and your rehabilitation. °Your knee is the joint between the large bones (femur and tibia) in your leg. Cartilage covers these bone ends which are smooth and slippery and allow your knee to bend and move smoothly. Two menisci, thick, semi-lunar shaped pads of cartilage which form a rim inside the joint, help absorb shock and stabilize your knee. Ligaments bind the bones together and support your knee joint. Muscles move the joint, help support your knee, and take stress off the joint itself. Because of this all programs and physical therapy to rehabilitate an injured or repaired knee require rebuilding and strengthening your muscles. °AFTER THE PROCEDURE °· After the procedure, you will be moved to a recovery area until most of the effects of the medication have worn off. Your caregiver will discuss the test results with you.  °· Only take over-the-counter or prescription medicines for pain, discomfort, or fever as directed by your caregiver.  °SEEK MEDICAL CARE IF:  °· You have increased bleeding from your wounds.  °· You see  redness, swelling, or have increasing pain in your wounds.  °· You have pus coming from your wound.  °· You have an oral temperature above 102° F (38.9° C).  °· You notice a bad smell coming from the wound or dressing.  °· You have severe pain with any motion of your knee.  °SEEK IMMEDIATE MEDICAL CARE IF:  °· You develop a rash.  °· You have difficulty breathing.  °· You have any allergic problems.  °FURTHER INSTRUCTIONS:  °· ICE to the affected knee every three hours for 30 minutes at a time and then as needed for pain and swelling.  Continue to use ice on the knee for pain and swelling from surgery. You may notice swelling that will progress down to the foot and ankle.  This is normal after surgery.  Elevate the leg when you are not up walking on it.   ° °DIET °You may resume your previous home diet once your are discharged from the hospital. ° °DRESSING / WOUND CARE / SHOWERING °You may start showering two days after being discharged home but do not submerge the incisions under water.  °Change dressing 48 hours after the procedure and then cover the small incisions with band aids until your follow up visit. °Change the surgical dressings daily and reapply a dry dressing each time.  ° °ACTIVITY °Walk with your walker as instructed. °Use walker as long as suggested by your caregivers. °Avoid periods of inactivity such as sitting longer than an hour when not asleep. This helps prevent blood clots.  °You may resume a sexual relationship in one month or when given the OK by your doctor.  °You may return to   work once you are cleared by your doctor.  °Do not drive a car for 6 weeks or until released by you surgeon.  °Do not drive while taking narcotics. ° °WEIGHT BEARING AS TOLERATED ° °POSTOPERATIVE CONSTIPATION PROTOCOL °Constipation - defined medically as fewer than three stools per week and severe constipation as less than one stool per week. ° °One of the most common issues patients have following surgery is  constipation.  Even if you have a regular bowel pattern at home, your normal regimen is likely to be disrupted due to multiple reasons following surgery.  Combination of anesthesia, postoperative narcotics, change in appetite and fluid intake all can affect your bowels.  In order to avoid complications following surgery, here are some recommendations in order to help you during your recovery period. ° °Colace (docusate) - Pick up an over-the-counter form of Colace or another stool softener and take twice a day as long as you are requiring postoperative pain medications.  Take with a full glass of water daily.  If you experience loose stools or diarrhea, hold the colace until you stool forms back up.  If your symptoms do not get better within 1 week or if they get worse, check with your doctor. ° °Dulcolax (bisacodyl) - Pick up over-the-counter and take as directed by the product packaging as needed to assist with the movement of your bowels.  Take with a full glass of water.  Use this product as needed if not relieved by Colace only.  ° °MiraLax (polyethylene glycol) - Pick up over-the-counter to have on hand.  MiraLax is a solution that will increase the amount of water in your bowels to assist with bowel movements.  Take as directed and can mix with a glass of water, juice, soda, coffee, or tea.  Take if you go more than two days without a movement. °Do not use MiraLax more than once per day. Call your doctor if you are still constipated or irregular after using this medication for 7 days in a row. ° °If you continue to have problems with postoperative constipation, please contact the office for further assistance and recommendations.  If you experience "the worst abdominal pain ever" or develop nausea or vomiting, please contact the office immediatly for further recommendations for treatment. ° °ITCHING ° If you experience itching with your medications, try taking only a single pain pill, or even half a pain pill  at a time.  You can also use Benadryl over the counter for itching or also to help with sleep.  ° °TED HOSE STOCKINGS °Wear the elastic stockings on both legs for three weeks following surgery during the day but you may remove then at night for sleeping. ° °MEDICATIONS °See your medication summary on the “After Visit Summary” that the nursing staff will review with you prior to discharge.  You may have some home medications which will be placed on hold until you complete the course of blood thinner medication.  It is important for you to complete the blood thinner medication as prescribed by your surgeon.  Continue your approved medications as instructed at time of discharge. °Do not drive while taking narcotics.  ° °PRECAUTIONS °If you experience chest pain or shortness of breath - call 911 immediately for transfer to the hospital emergency department.  °If you develop a fever greater that 101 F, purulent drainage from wound, increased redness or drainage from wound, foul odor from the wound/dressing, or calf pain - CONTACT YOUR SURGEON.   °                                                °  FOLLOW-UP APPOINTMENTS Make sure you keep all of your appointments after your operation with your surgeon and caregivers. You should call the office at [phone]  and make an appointment for approximately one week after the date of your surgery or on the date instructed by your surgeon outlined in the "After Visit Summary".  RANGE OF MOTION AND STRENGTHENING EXERCISES  Rehabilitation of the knee is important following a knee injury or an operation. After just a few days of immobilization, the muscles of the thigh which control the knee become weakened and shrink (atrophy). Knee exercises are designed to build up the tone and strength of the thigh muscles and to improve knee motion. Often times heat used for twenty to thirty minutes before working out will loosen up your tissues and help with improving the range of motion  but do not use heat for the first two weeks following surgery. These exercises can be done on a training (exercise) mat, on the floor, on a table or on a bed. Use what ever works the best and is most comfortable for you Knee exercises include:  QUAD STRENGTHENING EXERCISES Strengthening Quadriceps Sets  Tighten muscles on top of thigh by pushing knees down into floor or table. Hold for 20 seconds. Repeat 10 times. Do 2 sessions per day.     Strengthening Terminal Knee Extension  With knee bent over bolster, straighten knee by tightening muscle on top of thigh. Be sure to keep bottom of knee on bolster. Hold for 20 seconds. Repeat 10 times. Do 2 sessions per day.   Straight Leg with Bent Knee  Lie on back with opposite leg bent. Keep involved knee slightly bent at knee and raise leg 4-6". Hold for 10 seconds. Repeat 20 times per set. Do 2 sets per session. Do 2 sessions per day.     General Anesthesia, Adult, Care After Refer to this sheet in the next few weeks. These instructions provide you with information on caring for yourself after your procedure. Your health care provider may also give you more specific instructions. Your treatment has been planned according to current medical practices, but problems sometimes occur. Call your health care provider if you have any problems or questions after your procedure. WHAT TO EXPECT AFTER THE PROCEDURE After the procedure, it is typical to experience:  Sleepiness.  Nausea and vomiting. HOME CARE INSTRUCTIONS  For the first 24 hours after general anesthesia:  Have a responsible person with you.  Do not drive a car. If you are alone, do not take public transportation.  Do not drink alcohol.  Do not take medicine that has not been prescribed by your health care provider.  Do not sign important papers or make important decisions.  You may resume a normal diet and activities as directed by your health care provider.  Change  bandages (dressings) as directed.  If you have questions or problems that seem related to general anesthesia, call the hospital and ask for the anesthetist or anesthesiologist on call. SEEK MEDICAL CARE IF:  You have nausea and vomiting that continue the day after anesthesia.  You develop a rash. SEEK IMMEDIATE MEDICAL CARE IF:   You have difficulty breathing.  You have chest pain.  You have any allergic problems.   This information is not intended to replace advice given to you by your health care provider. Make sure you discuss any questions you have with your health care provider.   Document Released: 01/26/2001 Document Revised: 11/10/2014 Document Reviewed:  02/18/2012 Elsevier Interactive Patient Education [employer].

## 2016-04-08 NOTE — Op Note (Signed)
Preoperative diagnosis-  Right knee medial meniscal tear  Postoperative diagnosis Right- knee medial meniscal tear plus medial femoral chondral defect  Procedure- Right knee arthroscopy with medial meniscal debridement and chondroplasty   Surgeon- Dione Plover. Axel Meas, MD  Anesthesia-General  EBL-  Minimal  Complications- None  Condition- PACU - hemodynamically stable.  Brief clinical note- -Larry Butler is a 75 y.o.  male with a several month history of right knee pain and mechanical symptoms. Exam and history suggested medial meniscal tear confirmed by MRI. The patient presents now for arthroscopy and debridement   Procedure in detail -       After successful administration of General anesthetic, a tourmiquet is placed high on the Right  thigh and the Right lower extremity is prepped and draped in the usual sterile fashion. Time out is performed by the surgical team. Standard superomedial and inferolateral portal sites are marked and incisions made with an 11 blade. The inflow cannula is passed through the superomedial portal and camera through the inferolateral portal and inflow is initiated. Arthroscopic visualization proceeds.      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 The medial and lateral gutters are visualized and there are  no loose bodies. Flexion and valgus force is applied to the knee and the medial compartment is entered. A spinal needle is passed into the joint through the site marked for the inferomedial portal. A small incision is made and the dilator passed into the joint. The findings for the medial compartment are unstable tear of body and posterior horn of medial meniscus and 1 x 1 cm unstable chondral defect of the medial femoral condyle. The tear is debrided to a stable base with baskets and a shaver and sealed off with the Arthrocare. The shaver is used to debride the unstable  cartilage to a stable cartilaginous base with stable edges. It is probed and found to be stable.    The intercondylar notch is visualized and the ACL appears normal. The lateral compartment is entered and the findings are normal .     The joint is again inspected and there are no other tears, defects or loose bodies identified. The arthroscopic equipment is then removed from the inferior portals which are closed with interrupted 4-0 nylon. 20 ml of .25% Marcaine with epinephrine are injected through the inflow cannula and the cannula is then removed and the portal closed with nylon. The incisions are cleaned and dried and a bulky sterile dressing is applied. The patient is then awakened and transported to recovery in stable condition.   04/08/2016, 3:32 PM

## 2016-04-08 NOTE — Anesthesia Procedure Notes (Signed)
Procedure Name: LMA Insertion Date/Time: 04/08/2016 2:51 PM Performed by: Dione Booze Pre-anesthesia Checklist: Emergency Drugs available, Patient identified, Patient being monitored and Suction available Patient Re-evaluated:Patient Re-evaluated prior to inductionOxygen Delivery Method: Circle system utilized Preoxygenation: Pre-oxygenation with 100% oxygen Intubation Type: IV induction LMA: LMA inserted LMA Size: 4.0 Number of attempts: 1 Placement Confirmation: positive ETCO2 Tube secured with: Tape Dental Injury: Teeth and Oropharynx as per pre-operative assessment

## 2016-04-08 NOTE — H&P (Signed)
CC- Larry Butler is a 75 y.o. male who presents with right knee pain.  HPI- . Knee Pain: Patient presents with knee pain involving the  right knee. Onset of the symptoms was several months ago. Inciting event: none known. Current symptoms include giving out, pain located medially, stiffness and swelling. Pain is aggravated by going up and down stairs, lateral movements, pivoting, rising after sitting and walking.  Patient has had no prior knee problems. Evaluation to date: MRI: abnormal medial meniscal tear. Treatment to date: corticosteroid injection which was not very effective.  Past Medical History  Diagnosis Date  . COPD (chronic obstructive pulmonary disease) ([place])   . GERD (gastroesophageal reflux disease)   . Chronic kidney disease   . PONV (postoperative nausea and vomiting)   . Shortness of breath dyspnea     04/01/16  states no changes- "with exertion"  . Arthritis   . Gout   . Asthma     rarely uses inhalers/ followed by PCP  . HOH (hard of hearing)     Past Surgical History  Procedure Laterality Date  . Appendectomy    . Tonsilectomy/adenoidectomy with myringotomy    . Back surgery      fusion 1980's  . Prostate surgery    . Eye surgery Bilateral     bliateral IOL with lens implant    Prior to Admission medications   Medication Sig Start Date End Date Taking? Authorizing Provider  acetaminophen (TYLENOL) 500 MG tablet Take 500-1,000 mg by mouth every 6 (six) hours as needed (For pain.).   Yes Historical Provider, MD  albuterol (PROVENTIL HFA;VENTOLIN HFA) 108 (90 BASE) MCG/ACT inhaler Inhale 2 puffs into the lungs every 6 (six) hours as needed for wheezing or shortness of breath. 03/07/15  Yes Susy Frizzle, MD  allopurinol (ZYLOPRIM) 300 MG tablet TAKE 1 TABLET(300 MG) BY MOUTH DAILY Patient taking differently: TAKE HALF A TABLET (150MG ) BY MOUTH DAILY. 12/03/15  Yes Susy Frizzle, MD  amLODipine (NORVASC) 2.5 MG tablet TAKE 1 TABLET(2.5 MG) BY MOUTH EVERY  EVENING Patient taking differently: Take 2.5 mg by mouth daily after supper.  02/07/16  Yes Minus Breeding, MD  aspirin EC 81 MG tablet Take 81 mg by mouth daily.   Yes Historical Provider, MD  esomeprazole (NEXIUM) 40 MG capsule Take 1 capsule (40 mg total) by mouth daily as needed. Patient taking differently: Take 40 mg by mouth daily.  03/07/15  Yes Susy Frizzle, MD  fluticasone (FLONASE) 50 MCG/ACT nasal spray Place 2 sprays into both nostrils daily as needed for allergies.    Yes Historical Provider, MD  Polyvinyl Alcohol-Povidone (REFRESH OP) Place 2 drops into both eyes daily as needed (For dry eyes.).   Yes Historical Provider, MD  bismuth subsalicylate (PEPTO BISMOL) 262 MG chewable tablet Chew 524 mg by mouth as needed. Per dentist recommendations- Last dose 03/29/16    Historical Provider, MD   KNEE EXAM antalgic gait, soft tissue tenderness over medial joint line, effusion, negative drawer sign, collateral ligaments intact  Physical Examination: General appearance - alert, well appearing, and in no distress Mental status - alert, oriented to person, place, and time Chest - clear to auscultation, no wheezes, rales or rhonchi, symmetric air entry Heart - normal rate, regular rhythm, normal S1, S2, no murmurs, rubs, clicks or gallops Abdomen - soft, nontender, nondistended, no masses or organomegaly Neurological - alert, oriented, normal speech, no focal findings or movement disorder noted   Asessment/Plan--- Right knee medial  meniscal tear- - Plan right knee arthroscopy with meniscal debridement. Procedure risks and potential comps discussed with patient who elects to proceed. Goals are decreased pain and increased function with a high likelihood of achieving both

## 2016-04-08 NOTE — Anesthesia Postprocedure Evaluation (Signed)
Anesthesia Post Note  Patient: Larry Butler  Procedure(s) Performed: Procedure(s) (LRB): RIGHT ARTHROSCOPY KNEE WITH MENISCAL DEBRIDEMENT, AND CONDROPLASTY (Right)  Patient location during evaluation: PACU Anesthesia Type: General Level of consciousness: awake and alert Pain management: pain level controlled Vital Signs Assessment: post-procedure vital signs reviewed and stable Respiratory status: spontaneous breathing, nonlabored ventilation, respiratory function stable and patient connected to nasal cannula oxygen Cardiovascular status: blood pressure returned to baseline and stable Postop Assessment: no signs of nausea or vomiting Anesthetic complications: no    Last Vitals:  Filed Vitals:   04/08/16 1646 04/08/16 1748  BP: 134/67 126/78  Pulse: 56 68  Temp: 36.5 C 36.7 C  Resp: 16 18    Last Pain:  Filed Vitals:   04/08/16 1750  PainSc: 6                  Louan Base EDWARD

## 2016-04-08 NOTE — Anesthesia Preprocedure Evaluation (Addendum)
Anesthesia Evaluation  Patient identified by MRN, date of birth, ID band Patient awake    Reviewed: Allergy & Precautions, H&P , Patient's Chart, lab work & pertinent test results, reviewed documented beta blocker date and time   Airway Mallampati: II  TM Distance: >3 FB Neck ROM: full    Dental no notable dental hx.    Pulmonary asthma , COPD, former smoker,    Pulmonary exam normal breath sounds clear to auscultation       Cardiovascular  Rhythm:regular Rate:Normal     Neuro/Psych    GI/Hepatic GERD  Medicated,  Endo/Other    Renal/GU      Musculoskeletal   Abdominal   Peds  Hematology   Anesthesia Other Findings  PONV    COPD  Shortness of breath dyspnea   states no changes- "with exertion"  Arthritis   Gout    Asthma rarely uses inhalers          Reproductive/Obstetrics                            Anesthesia Physical Anesthesia Plan  ASA: II  Anesthesia Plan:    Post-op Pain Management:    Induction: Intravenous  Airway Management Planned: LMA  Additional Equipment:   Intra-op Plan:   Post-operative Plan:   Informed Consent: I have reviewed the patients History and Physical, chart, labs and discussed the procedure including the risks, benefits and alternatives for the proposed anesthesia with the patient or authorized representative who has indicated his/her understanding and acceptance.   Dental Advisory Given and Dental advisory given  Plan Discussed with: CRNA and Surgeon  Anesthesia Plan Comments: (Discussed GA with LMA, possible sore throat, potential need to switch to ETT, N/V, pulmonary aspiration. Questions answered. )        Anesthesia Quick Evaluation

## 2016-04-08 NOTE — Interval H&P Note (Signed)
History and Physical Interval Note:  04/08/2016 1:51 PM  [name]  has presented today for surgery, with the diagnosis of RIGHT KNEE MEDIAL MENISCUS TEAR  The various methods of treatment have been discussed with the patient and family. After consideration of risks, benefits and other options for treatment, the patient has consented to  Procedure(s): RIGHT ARTHROSCOPY KNEE WITH MENISCAL DEBRIDEMENT (Right) as a surgical intervention .  The patient's history has been reviewed, patient examined, no change in status, stable for surgery.  I have reviewed the patient's chart and labs.  Questions were answered to the patient's satisfaction.     Larry Butler

## 2016-04-08 NOTE — Transfer of Care (Signed)
Immediate Anesthesia Transfer of Care Note  Patient: Larry Butler  Procedure(s) Performed: Procedure(s): RIGHT ARTHROSCOPY KNEE WITH MENISCAL DEBRIDEMENT, AND CONDROPLASTY (Right)  Patient Location: PACU  Anesthesia Type:General  Level of Consciousness: awake, alert  and patient cooperative  Airway & Oxygen Therapy: Patient Spontanous Breathing and Patient connected to face mask oxygen  Post-op Assessment: Report given to RN and Post -op Vital signs reviewed and stable  Post vital signs: Reviewed and stable  Last Vitals:  Filed Vitals:   04/08/16 1144  BP: 146/74  Pulse: 74  Temp: 36.7 C  Resp: 18    Last Pain:  Filed Vitals:   04/08/16 1302  PainSc: 3       Patients Stated Pain Goal: 4 ([id number])  Complications: No apparent anesthesia complications

## 2016-04-09 ENCOUNTER — Encounter (HOSPITAL_COMMUNITY): Payer: Self-pay | Admitting: Orthopedic Surgery

## 2016-04-14 ENCOUNTER — Other Ambulatory Visit: Payer: Self-pay | Admitting: Family Medicine

## 2016-04-14 NOTE — Telephone Encounter (Signed)
Refill appropriate and filled per protocol. 

## 2016-04-17 ENCOUNTER — Encounter: Payer: Self-pay | Admitting: Physician Assistant

## 2016-04-17 ENCOUNTER — Ambulatory Visit (INDEPENDENT_AMBULATORY_CARE_PROVIDER_SITE_OTHER): Payer: Medicare Other | Admitting: Physician Assistant

## 2016-04-17 VITALS — BP 132/86 | HR 76 | Temp 97.8°F | Resp 18 | Wt 209.0 lb

## 2016-04-17 DIAGNOSIS — R140 Abdominal distension (gaseous): Secondary | ICD-10-CM | POA: Diagnosis not present

## 2016-04-17 DIAGNOSIS — R143 Flatulence: Secondary | ICD-10-CM | POA: Diagnosis not present

## 2016-04-17 DIAGNOSIS — R197 Diarrhea, unspecified: Secondary | ICD-10-CM

## 2016-04-17 LAB — URINALYSIS, ROUTINE W REFLEX MICROSCOPIC
BILIRUBIN URINE: NEGATIVE
GLUCOSE, UA: NEGATIVE
Hgb urine dipstick: NEGATIVE
Leukocytes, UA: NEGATIVE
Nitrite: NEGATIVE
Specific Gravity, Urine: 1.025 (ref 1.001–1.035)
pH: 5.5 (ref 5.0–8.0)

## 2016-04-17 LAB — CBC WITH DIFFERENTIAL/PLATELET
BASOS PCT: 0 %
Basophils Absolute: 0 cells/uL (ref 0–200)
EOS ABS: 128 {cells}/uL (ref 15–500)
EOS PCT: 2 %
HCT: 45.8 % (ref 38.5–50.0)
Hemoglobin: 15.4 g/dL (ref 13.0–17.0)
LYMPHS PCT: 21 %
Lymphs Abs: 1344 cells/uL (ref 850–3900)
MCH: 28.9 pg (ref 27.0–33.0)
MCHC: 33.6 g/dL (ref 32.0–36.0)
MCV: 85.9 fL (ref 80.0–100.0)
MONOS PCT: 10 %
MPV: 9.4 fL (ref 7.5–12.5)
Monocytes Absolute: 640 cells/uL (ref 200–950)
NEUTROS ABS: 4288 {cells}/uL (ref 1500–7800)
Neutrophils Relative %: 67 %
PLATELETS: 183 10*3/uL (ref 140–400)
RBC: 5.33 MIL/uL (ref 4.20–5.80)
RDW: 14.5 % (ref 11.0–15.0)
WBC: 6.4 10*3/uL (ref 3.8–10.8)

## 2016-04-17 LAB — URINALYSIS, MICROSCOPIC ONLY
BACTERIA UA: NONE SEEN [HPF]
CRYSTALS: NONE SEEN [HPF]
Casts: NONE SEEN [LPF]
WBC UA: NONE SEEN WBC/HPF (ref <=5)
Yeast: NONE SEEN [HPF]

## 2016-04-18 ENCOUNTER — Telehealth: Payer: Self-pay | Admitting: Gastroenterology

## 2016-04-18 LAB — COMPLETE METABOLIC PANEL WITH GFR
ALT: 12 U/L (ref 9–46)
AST: 13 U/L (ref 10–35)
Albumin: 4.3 g/dL (ref 3.6–5.1)
Alkaline Phosphatase: 64 U/L (ref 40–115)
BUN: 26 mg/dL — ABNORMAL HIGH (ref 7–25)
CHLORIDE: 105 mmol/L (ref 98–110)
CO2: 26 mmol/L (ref 20–31)
Calcium: 9.5 mg/dL (ref 8.6–10.3)
Creat: 1.25 mg/dL — ABNORMAL HIGH (ref 0.70–1.18)
GFR, EST AFRICAN AMERICAN: 66 mL/min (ref >=60)
GFR, EST NON AFRICAN AMERICAN: 57 mL/min — AB (ref >=60)
GLUCOSE: 96 mg/dL (ref 70–99)
Potassium: 4.3 mmol/L (ref 3.5–5.3)
SODIUM: 145 mmol/L (ref 135–146)
Total Bilirubin: 0.5 mg/dL (ref 0.2–1.2)
Total Protein: 7 g/dL (ref 6.1–8.1)

## 2016-04-18 LAB — CELIAC PANEL 10
ENDOMYSIAL SCREEN: NEGATIVE
GLIADIN IGA: 3 U (ref <20)
GLIADIN IGG: 2 U (ref <20)
IgA: 158 mg/dL (ref 81–463)
TISSUE TRANSGLUT AB: 1 U/mL (ref <6)
TISSUE TRANSGLUTAMINASE AB, IGA: 1 U/mL (ref <4)

## 2016-04-18 LAB — H. PYLORI BREATH TEST: H. PYLORI BREATH TEST: NOT DETECTED

## 2016-04-18 NOTE — Telephone Encounter (Signed)
Pt wife states she has tried to get the records from previous GI and has been unsuccessful.  She will try again and have them faxed here if she can get them.. Appt scheduled for 05/13/16 at 10 am

## 2016-04-18 NOTE — Progress Notes (Signed)
Patient ID: Larry Butler MRN: [id number], DOB: Aug 27, 1941, 75 y.o. Date of Encounter: @DATE @  Chief Complaint:  Chief Complaint  Patient presents with  . stomach issues x 2 mths    mild intermittent nausea, reflux, diarrhea, dark urine    HPI: 75 y.o. year old white male  presents with his wife for OV.  Says he has had these symptoms for > 2 months.  Stools---alternate b/t "pellets" and diarrhea.  "At times, abdomen gets hard like a rock" "Alot of gas--taking a lot of Gas-X" Some nausea--not often, but sometimes, after eating. No vomiting. Sometiems after eats abdomen feels tight.   Taking Nexium dialy.    Past Medical History  Diagnosis Date  . COPD (chronic obstructive pulmonary disease) ([place])   . GERD (gastroesophageal reflux disease)   . Chronic kidney disease   . PONV (postoperative nausea and vomiting)   . Shortness of breath dyspnea     04/01/16  states no changes- "with exertion"  . Arthritis   . Gout   . Asthma     rarely uses inhalers/ followed by PCP  . HOH (hard of hearing)      Home Meds: Outpatient Prescriptions Prior to Visit  Medication Sig Dispense Refill  . acetaminophen (TYLENOL) 500 MG tablet Take 500-1,000 mg by mouth every 6 (six) hours as needed (For pain.).    Marland Kitchen albuterol (PROVENTIL HFA;VENTOLIN HFA) 108 (90 BASE) MCG/ACT inhaler Inhale 2 puffs into the lungs every 6 (six) hours as needed for wheezing or shortness of breath. 1 Inhaler 5  . allopurinol (ZYLOPRIM) 300 MG tablet TAKE HALF A TABLET (150MG ) BY MOUTH DAILY. 30 tablet 5  . amLODipine (NORVASC) 2.5 MG tablet Take 1 tablet (2.5 mg total) by mouth daily after supper. 90 tablet 3  . aspirin EC 81 MG tablet Take 81 mg by mouth daily.    Marland Kitchen bismuth subsalicylate (PEPTO BISMOL) 262 MG chewable tablet Chew 524 mg by mouth as needed. Per dentist recommendations- Last dose 03/29/16    . esomeprazole (NEXIUM) 40 MG capsule TAKE 1 CAPSULE(40 MG) BY MOUTH DAILY AS NEEDED 30 capsule 0  .  fluticasone (FLONASE) 50 MCG/ACT nasal spray Place 2 sprays into both nostrils daily as needed for allergies.     . Polyvinyl Alcohol-Povidone (REFRESH OP) Place 2 drops into both eyes daily as needed (For dry eyes.).    Marland Kitchen HYDROcodone-acetaminophen (NORCO) 5-325 MG tablet Take 1-2 tablets by mouth every 4 (four) hours as needed for moderate pain. (Patient not taking: Reported on 04/17/2016) 30 tablet 0  . methocarbamol (ROBAXIN) 500 MG tablet Take 1 tablet (500 mg total) by mouth 4 (four) times daily. As needed for muscle spasm (Patient not taking: Reported on 04/17/2016) 30 tablet 1   No facility-administered medications prior to visit.    Allergies: No Known Allergies  Social History   Social History  . Marital Status: Married    Spouse Name: N/A  . Number of Children: N/A  . Years of Education: N/A   Occupational History  . Not on file.   Social History Main Topics  . Smoking status: Former Smoker    Quit date: 09/03/1977  . Smokeless tobacco: Never Used  . Alcohol Use: No  . Drug Use: No  . Sexual Activity: Yes   Other Topics Concern  . Not on file   Social History Narrative   One kind of adopted daughter.      Family History  Problem Relation Age of Onset  .  Cancer Mother     breast  . Stroke Mother   . Cancer Father 46    colon  . Diabetes Sister   . Heart disease Brother 58    CAD, Pacemaker  . Diabetes Brother   . Hearing loss Brother   . Diabetes Brother   . Diabetes Brother   . Diabetes Brother   . Heart disease Brother 92    AVR  . Hearing loss Brother   . Cancer Brother     prostate  . Heart disease Brother   . Hearing loss Brother   . COPD Brother      Review of Systems:  See HPI for pertinent ROS. All other ROS negative.    Physical Exam: Blood pressure 132/86, pulse 76, temperature 97.8 F (36.6 C), temperature source Oral, resp. rate 18, weight 209 lb (94.802 kg)., Body mass index is 32.73 kg/(m^2). General: Obese WM. Appears in no  acute distress. Neck: Supple. No thyromegaly. No lymphadenopathy. Lungs: Clear bilaterally to auscultation without wheezes, rales, or rhonchi. Breathing is unlabored. Heart: RRR with S1 S2. No murmurs, rubs, or gallops. Abdomen: Soft, non-tender, non-distended with normoactive bowel sounds. No hepatomegaly. No rebound/guarding. No obvious abdominal masses. There is no area of tenderness with palpation of entire abdomen. Musculoskeletal:  Strength and tone normal for age. Extremities/Skin: Warm and dry.  Neuro: Alert and oriented X 3. Moves all extremities spontaneously. Gait is normal. CNII-XII grossly in tact. Psych:  Responds to questions appropriately with a normal affect.     ASSESSMENT AND PLAN:  75 y.o. year old male with  1. Abdominal bloating  He is to start probiotic---UltraFlora IB--at [facility] in [place] 40mg  QD Will obtain labs and f/u with him when I get results.  Will order GI Referral Says he last saw a GI docotr in [place] ~ 5 years ago.  - Ambulatory referral to Gastroenterology - CBC with Differential/Platelet - COMPLETE METABOLIC PANEL WITH GFR - Celiac panel 10 - H. pylori breath test - Urinalysis, Routine w reflex microscopic (not at [facility])  2. Diarrhea, unspecified type - Ambulatory referral to Gastroenterology - CBC with Differential/Platelet - COMPLETE METABOLIC PANEL WITH GFR - Celiac panel 10 - H. pylori breath test - Urinalysis, Routine w reflex microscopic (not at [facility])  3. Flatulence - Ambulatory referral to Gastroenterology - CBC with Differential/Platelet - COMPLETE METABOLIC PANEL WITH GFR - Celiac panel 10 - H. pylori breath test - Urinalysis, Routine w reflex microscopic (not at [facility])   Signed, [facility] [place], [place], [facility] 04/18/2016 10:14 AM

## 2016-05-13 ENCOUNTER — Encounter: Payer: Self-pay | Admitting: Gastroenterology

## 2016-05-13 ENCOUNTER — Ambulatory Visit (INDEPENDENT_AMBULATORY_CARE_PROVIDER_SITE_OTHER): Payer: Medicare Other | Admitting: Gastroenterology

## 2016-05-13 VITALS — BP 128/72 | HR 68 | Ht 67.0 in | Wt 207.0 lb

## 2016-05-13 DIAGNOSIS — R197 Diarrhea, unspecified: Secondary | ICD-10-CM | POA: Diagnosis not present

## 2016-05-13 MED ORDER — NA SULFATE-K SULFATE-MG SULF 17.5-3.13-1.6 GM/177ML PO SOLN
1.0000 | Freq: Once | ORAL | Status: DC
Start: 2016-05-13 — End: 2016-07-15

## 2016-05-13 NOTE — Patient Instructions (Signed)
You will be set up for a colonoscopy for change in bowels, FH of colon cancer.

## 2016-05-13 NOTE — Progress Notes (Signed)
HPI: This is a   very pleasant 75 year old man   who was referred to me by Larry Frizzle, MD  to evaluate  loose stools, family history of colon cancer .    Chief complaint is recent diarrheal illness, family history of colon cancer  Lab results June 2017 H. pylori breath test negative, CBC normal, complete metabolic profile normal except for mild renal insufficiency;  celiac sprue panel was negative  Had loose stools, bloating for about 2 months.  Ended about a month ago.  No preceeding antibiotics.  Was very watery  Stools; 2-3 per day, never noctu  His father was diagnosed in early 48s with colon cancer.  He had colonoscopy about 6-7 years.  DR. Anderson in [place];  This was normal.  But he had polyps 5 years before by colonoscopy.  He's been intentionally losing weight;   He's also RLQ pains for 2-3 months, shooting pains.  Very brief pain (1 minute).  Feels it a few times per week.  Never takes NSAIDs  Knee surgery June 6th for torn meniscus.  Review of systems: Pertinent positive and negative review of systems were noted in the above HPI section. Complete review of systems was performed and was otherwise normal.   Past Medical History  Diagnosis Date  . COPD (chronic obstructive pulmonary disease) ([place])   . GERD (gastroesophageal reflux disease)   . Chronic kidney disease   . PONV (postoperative nausea and vomiting)   . Shortness of breath dyspnea     04/01/16  states no changes- "with exertion"  . Arthritis   . Gout   . Asthma     rarely uses inhalers/ followed by PCP  . HOH (hard of hearing)     Past Surgical History  Procedure Laterality Date  . Appendectomy    . Tonsilectomy/adenoidectomy with myringotomy    . Back surgery      fusion 1980's  . Prostate surgery    . Eye surgery Bilateral     bliateral IOL with lens implant  . Knee arthroscopy Right 04/08/2016    Procedure: RIGHT ARTHROSCOPY KNEE WITH MENISCAL DEBRIDEMENT, AND CONDROPLASTY;   Surgeon: Gaynelle Arabian, MD;  Location: [facility];  Service: Orthopedics;  Laterality: Right;    Current Outpatient Prescriptions  Medication Sig Dispense Refill  . acetaminophen (TYLENOL) 500 MG tablet Take 500-1,000 mg by mouth every 6 (six) hours as needed (For pain.).    Marland Kitchen albuterol (PROVENTIL HFA;VENTOLIN HFA) 108 (90 BASE) MCG/ACT inhaler Inhale 2 puffs into the lungs every 6 (six) hours as needed for wheezing or shortness of breath. 1 Inhaler 5  . allopurinol (ZYLOPRIM) 300 MG tablet TAKE HALF A TABLET (150MG ) BY MOUTH DAILY. 30 tablet 5  . amLODipine (NORVASC) 2.5 MG tablet Take 1 tablet (2.5 mg total) by mouth daily after supper. 90 tablet 3  . aspirin EC 81 MG tablet Take 81 mg by mouth daily.    Marland Kitchen bismuth subsalicylate (PEPTO BISMOL) 262 MG chewable tablet Chew 524 mg by mouth as needed. Per dentist recommendations- Last dose 03/29/16    . esomeprazole (NEXIUM) 40 MG capsule TAKE 1 CAPSULE(40 MG) BY MOUTH DAILY AS NEEDED 30 capsule 0  . fluticasone (FLONASE) 50 MCG/ACT nasal spray Place 2 sprays into both nostrils daily as needed for allergies.     . Polyvinyl Alcohol-Povidone (REFRESH OP) Place 2 drops into both eyes daily as needed (For dry eyes.).    Marland Kitchen Probiotic Product (PROBIOTIC ADVANCED PO) Take 1 capsule by  mouth daily.     No current facility-administered medications for this visit.    Allergies as of 05/13/2016  . (No Known Allergies)    Family History  Problem Relation Age of Onset  . Cancer Mother     breast  . Stroke Mother   . Cancer Father 57    colon  . Diabetes Sister   . Heart disease Brother 51    CAD, Pacemaker  . Diabetes Brother   . Hearing loss Brother   . Diabetes Brother   . Diabetes Brother   . Diabetes Brother   . Heart disease Brother 49    AVR  . Hearing loss Brother   . Cancer Brother     prostate  . Heart disease Brother   . Hearing loss Brother   . COPD Brother     Social History   Social History  . Marital Status: Married     Spouse Name: N/A  . Number of Children: N/A  . Years of Education: N/A   Occupational History  . Not on file.   Social History Main Topics  . Smoking status: Former Smoker    Quit date: 09/03/1977  . Smokeless tobacco: Never Used  . Alcohol Use: No  . Drug Use: No  . Sexual Activity: Yes   Other Topics Concern  . Not on file   Social History Narrative   One kind of adopted daughter.       Physical Exam: BP 128/72 mmHg  Pulse 68  Ht 5\' 7"  (1.702 m)  Wt 207 lb (93.895 kg)  BMI 32.41 kg/m2 Constitutional: generally well-appearing Psychiatric: alert and oriented x3 Eyes: extraocular movements intact Mouth: oral pharynx moist, no lesions Neck: supple no lymphadenopathy Cardiovascular: heart regular rate and rhythm Lungs: clear to auscultation bilaterally Abdomen: soft, nontender, nondistended, no obvious ascites, no peritoneal signs, normal bowel sounds Extremities: no lower extremity edema bilaterally Skin: no lesions on visible extremities   Assessment and plan: 75 y.o. male with  Family history of colon cancer, recent diarrheal episode, intermittent right lower quadrant pains  His father had colon cancer diagnosed in his late 76s. This episode of loose stools which he had 2-3 months ago lasted several weeks. It may have been some type of an infection. He seems to be improving from that perspective after probiotic usage for several weeks. He was also and is still having intermittent right lower quadrant pains. Perhaps he has underlying Crohn's disease. I recommended we proceed with full colonoscopy at his soonest convenience. I see no reason for any further blood tests or imaging studies prior to then.   Owens Loffler, MD [facility] 05/13/2016, 10:01 AM  Cc: Larry Frizzle, MD

## 2016-05-28 ENCOUNTER — Encounter: Payer: Medicare Other | Admitting: Gastroenterology

## 2016-07-09 ENCOUNTER — Ambulatory Visit (INDEPENDENT_AMBULATORY_CARE_PROVIDER_SITE_OTHER): Payer: Medicare Other | Admitting: Family Medicine

## 2016-07-09 DIAGNOSIS — Z23 Encounter for immunization: Secondary | ICD-10-CM | POA: Diagnosis not present

## 2016-07-15 ENCOUNTER — Ambulatory Visit (AMBULATORY_SURGERY_CENTER): Payer: Medicare Other | Admitting: Gastroenterology

## 2016-07-15 ENCOUNTER — Encounter: Payer: Self-pay | Admitting: Gastroenterology

## 2016-07-15 VITALS — BP 118/76 | HR 60 | Temp 99.1°F | Resp 15 | Ht 67.0 in | Wt 207.0 lb

## 2016-07-15 DIAGNOSIS — R194 Change in bowel habit: Secondary | ICD-10-CM | POA: Diagnosis not present

## 2016-07-15 DIAGNOSIS — R197 Diarrhea, unspecified: Secondary | ICD-10-CM

## 2016-07-15 DIAGNOSIS — K5730 Diverticulosis of large intestine without perforation or abscess without bleeding: Secondary | ICD-10-CM | POA: Diagnosis not present

## 2016-07-15 MED ORDER — SODIUM CHLORIDE 0.9 % IV SOLN: 500.0000 mL | INTRAVENOUS | Status: DC

## 2016-07-15 NOTE — Op Note (Signed)
[facility] Patient Name: Larry Butler Procedure Date: 07/15/2016 9:04 AM MRN: [id number] Endoscopist: Milus Banister , MD Age: 75 Referring MD:  Date of Birth: 12-Apr-1941 Gender: Male Account #: [id number] Procedure:                Colonoscopy Indications:              Clinically significant diarrhea of unexplained                            origin, Change in bowel habits Medicines:                Monitored Anesthesia Care Procedure:                Pre-Anesthesia Assessment:                           - Prior to the procedure, a History and Physical                            was performed, and patient medications and                            allergies were reviewed. The patient's tolerance of                            previous anesthesia was also reviewed. The risks                            and benefits of the procedure and the sedation                            options and risks were discussed with the patient.                            All questions were answered, and informed consent                            was obtained. Prior Anticoagulants: The patient has                            taken no previous anticoagulant or antiplatelet                            agents. ASA Grade Assessment: II - A patient with                            mild systemic disease. After reviewing the risks                            and benefits, the patient was deemed in                            satisfactory condition to undergo the procedure.  After obtaining informed consent, the colonoscope                            was passed under direct vision. Throughout the                            procedure, the patient's blood pressure, pulse, and                            oxygen saturations were monitored continuously. The                            Model CF-HQ190L [id number]) scope was introduced                            through the anus and advanced  to the the terminal                            ileum. The colonoscopy was performed without                            difficulty. The patient tolerated the procedure                            well. The quality of the bowel preparation was                            excellent. The terminal ileum, ileocecal valve,                            appendiceal orifice, and rectum were photographed. Scope In: 9:14:38 AM Scope Out: 9:22:55 AM Scope Withdrawal Time: 0 hours 7 minutes 10 seconds  Total Procedure Duration: 0 hours 8 minutes 17 seconds  Findings:                 The terminal ileum appeared normal.                           Multiple small and large-mouthed diverticula were                            found in the left colon.                           The exam was otherwise without abnormality on                            direct and retroflexion views.                           Biopsies for histology were taken with a cold                            forceps from the entire colon for evaluation of  microscopic colitis. Complications:            No immediate complications. Estimated blood loss:                            None. Estimated Blood Loss:     Estimated blood loss: none. Impression:               - The examined portion of the ileum was normal.                           - Diverticulosis in the left colon.                           - The examination was otherwise normal on direct                            and retroflexion views.                           - Biopsies were taken with a cold forceps from the                            entire colon for evaluation of microscopic colitis. Recommendation:           - Patient has a contact number available for                            emergencies. The signs and symptoms of potential                            delayed complications were discussed with the                            patient. Return to normal  activities tomorrow.                            Written discharge instructions were provided to the                            patient.                           - Resume previous diet.                           - Continue present medications. Please also start                            once daily OTC imodium (one pill every morning                            shortly after waking up on a scheduled basis).                           - Await pathology results for final recommendations.                           -  No repeat colonoscopy due to age. Milus Banister, MD 07/15/2016 9:26:26 AM This report has been signed electronically.

## 2016-07-15 NOTE — Patient Instructions (Signed)
YOU HAD AN ENDOSCOPIC PROCEDURE TODAY AT [facility]:   Refer to the procedure report that was given to you for any specific questions about what was found during the examination.  If the procedure report does not answer your questions, please call your gastroenterologist to clarify.  If you requested that your care partner not be given the details of your procedure findings, then the procedure report has been included in a sealed envelope for you to review at your convenience later.  YOU SHOULD EXPECT: Some feelings of bloating in the abdomen. Passage of more gas than usual.  Walking can help get rid of the air that was put into your GI tract during the procedure and reduce the bloating. If you had a lower endoscopy (such as a colonoscopy or flexible sigmoidoscopy) you may notice spotting of blood in your stool or on the toilet paper. If you underwent a bowel prep for your procedure, you may not have a normal bowel movement for a few days.  Please Note:  You might notice some irritation and congestion in your nose or some drainage.  This is from the oxygen used during your procedure.  There is no need for concern and it should clear up in a day or so.  SYMPTOMS TO REPORT IMMEDIATELY:   Following lower endoscopy (colonoscopy or flexible sigmoidoscopy):  Excessive amounts of blood in the stool  Significant tenderness or worsening of abdominal pains  Swelling of the abdomen that is new, acute  Fever of 100F or higher   Following upper endoscopy (EGD)  Vomiting of blood or coffee ground material  New chest pain or pain under the shoulder blades  Painful or persistently difficult swallowing  New shortness of breath  Fever of 100F or higher  Black, tarry-looking stools  For urgent or emergent issues, a gastroenterologist can be reached at any hour by calling [phone].   DIET:  We do recommend a small meal at first, but then you may proceed to your regular diet.  Drink  plenty of fluids but you should avoid alcoholic beverages for 24 hours.  ACTIVITY:  You should plan to take it easy for the rest of today and you should NOT DRIVE or use heavy machinery until tomorrow (because of the sedation medicines used during the test).    FOLLOW UP: Our staff will call the number listed on your records the next business day following your procedure to check on you and address any questions or concerns that you may have regarding the information given to you following your procedure. If we do not reach you, we will leave a message.  However, if you are feeling well and you are not experiencing any problems, there is no need to return our call.  We will assume that you have returned to your regular daily activities without incident.  If any biopsies were taken you will be contacted by phone or by letter within the next 1-3 weeks.  Please call us at [phone] if you have not heard about the biopsies in 3 weeks.    SIGNATURES/CONFIDENTIALITY: You and/or your care partner have signed paperwork which will be entered into your electronic medical record.  These signatures attest to the fact that that the information above on your After Visit Summary has been reviewed and is understood.  Full responsibility of the confidentiality of this discharge information lies with you and/or your care-partner.   Handouts were given to your care partner on diverticulosis. Add  OTC Imodium one pill every morning shortly after waking up on a scheduled basis per Dr. Ardis Hughs. You may resume your current medications today. Await biopsy results. Please call if any questions or concerns.

## 2016-07-15 NOTE — Progress Notes (Signed)
No problems noted in the recovery room. maw 

## 2016-07-15 NOTE — Progress Notes (Signed)
Patient awakening,vss,report to rn 

## 2016-07-15 NOTE — Progress Notes (Signed)
Called to room to assist during endoscopic procedure.  Patient ID and intended procedure confirmed with present staff. Received instructions for my participation in the procedure from the performing physician.  

## 2016-07-16 ENCOUNTER — Telehealth: Payer: Self-pay | Admitting: *Deleted

## 2016-07-16 NOTE — Telephone Encounter (Signed)
  Follow up Call-  Call back number 07/15/2016  Post procedure Call Back phone  # [phone]  Permission to leave phone message Yes     Patient questions:  Do you have a fever, pain , or abdominal swelling? No. Pain Score  0 *  Have you tolerated food without any problems? Yes.    Have you been able to return to your normal activities? Yes.    Do you have any questions about your discharge instructions: Diet   No. Medications  No. Follow up visit  No.  Do you have questions or concerns about your Care? No.  Actions: * If pain score is 4 or above: No action needed, pain <4.

## 2016-07-20 ENCOUNTER — Encounter: Payer: Self-pay | Admitting: Gastroenterology

## 2016-07-30 DIAGNOSIS — L570 Actinic keratosis: Secondary | ICD-10-CM | POA: Diagnosis not present

## 2016-07-30 DIAGNOSIS — L820 Inflamed seborrheic keratosis: Secondary | ICD-10-CM | POA: Diagnosis not present

## 2016-07-30 DIAGNOSIS — L821 Other seborrheic keratosis: Secondary | ICD-10-CM | POA: Diagnosis not present

## 2016-08-14 ENCOUNTER — Inpatient Hospital Stay (HOSPITAL_COMMUNITY)
Admission: EM | Admit: 2016-08-14 | Discharge: 2016-08-17 | DRG: 390 | Disposition: A | Discharge status: Home / Self Care | Payer: Medicare Other | Attending: Internal Medicine | Admitting: Internal Medicine

## 2016-08-14 ENCOUNTER — Telehealth: Payer: Self-pay | Admitting: *Deleted

## 2016-08-14 ENCOUNTER — Ambulatory Visit (INDEPENDENT_AMBULATORY_CARE_PROVIDER_SITE_OTHER): Payer: Medicare Other | Admitting: Family Medicine

## 2016-08-14 ENCOUNTER — Encounter (HOSPITAL_COMMUNITY): Payer: Self-pay | Admitting: Emergency Medicine

## 2016-08-14 ENCOUNTER — Ambulatory Visit
Admission: RE | Admit: 2016-08-14 | Discharge: 2016-08-14 | Disposition: A | Discharge status: Home / Self Care | Payer: Medicare Other | Source: Ambulatory Visit | Attending: Family Medicine | Admitting: Family Medicine

## 2016-08-14 ENCOUNTER — Emergency Department (HOSPITAL_COMMUNITY): Payer: Medicare Other

## 2016-08-14 VITALS — BP 132/82 | HR 74 | Temp 98.7°F | Resp 20 | Ht 67.0 in | Wt 211.0 lb

## 2016-08-14 DIAGNOSIS — Z803 Family history of malignant neoplasm of breast: Secondary | ICD-10-CM

## 2016-08-14 DIAGNOSIS — I10 Essential (primary) hypertension: Secondary | ICD-10-CM | POA: Diagnosis not present

## 2016-08-14 DIAGNOSIS — Z833 Family history of diabetes mellitus: Secondary | ICD-10-CM | POA: Diagnosis not present

## 2016-08-14 DIAGNOSIS — K56600 Partial intestinal obstruction, unspecified as to cause: Principal | ICD-10-CM

## 2016-08-14 DIAGNOSIS — R1010 Upper abdominal pain, unspecified: Secondary | ICD-10-CM

## 2016-08-14 DIAGNOSIS — Z8042 Family history of malignant neoplasm of prostate: Secondary | ICD-10-CM

## 2016-08-14 DIAGNOSIS — I2510 Atherosclerotic heart disease of native coronary artery without angina pectoris: Secondary | ICD-10-CM | POA: Diagnosis not present

## 2016-08-14 DIAGNOSIS — J449 Chronic obstructive pulmonary disease, unspecified: Secondary | ICD-10-CM | POA: Diagnosis not present

## 2016-08-14 DIAGNOSIS — Z6832 Body mass index (BMI) 32.0-32.9, adult: Secondary | ICD-10-CM | POA: Diagnosis not present

## 2016-08-14 DIAGNOSIS — Z825 Family history of asthma and other chronic lower respiratory diseases: Secondary | ICD-10-CM

## 2016-08-14 DIAGNOSIS — Z823 Family history of stroke: Secondary | ICD-10-CM

## 2016-08-14 DIAGNOSIS — Z7951 Long term (current) use of inhaled steroids: Secondary | ICD-10-CM | POA: Diagnosis not present

## 2016-08-14 DIAGNOSIS — J45909 Unspecified asthma, uncomplicated: Secondary | ICD-10-CM | POA: Diagnosis present

## 2016-08-14 DIAGNOSIS — J4520 Mild intermittent asthma, uncomplicated: Secondary | ICD-10-CM | POA: Diagnosis not present

## 2016-08-14 DIAGNOSIS — Z7982 Long term (current) use of aspirin: Secondary | ICD-10-CM

## 2016-08-14 DIAGNOSIS — E860 Dehydration: Secondary | ICD-10-CM | POA: Diagnosis present

## 2016-08-14 DIAGNOSIS — K210 Gastro-esophageal reflux disease with esophagitis: Secondary | ICD-10-CM | POA: Diagnosis not present

## 2016-08-14 DIAGNOSIS — Z8249 Family history of ischemic heart disease and other diseases of the circulatory system: Secondary | ICD-10-CM | POA: Diagnosis not present

## 2016-08-14 DIAGNOSIS — N289 Disorder of kidney and ureter, unspecified: Secondary | ICD-10-CM

## 2016-08-14 DIAGNOSIS — E669 Obesity, unspecified: Secondary | ICD-10-CM | POA: Diagnosis present

## 2016-08-14 DIAGNOSIS — N189 Chronic kidney disease, unspecified: Secondary | ICD-10-CM | POA: Diagnosis not present

## 2016-08-14 DIAGNOSIS — R079 Chest pain, unspecified: Secondary | ICD-10-CM

## 2016-08-14 DIAGNOSIS — H9190 Unspecified hearing loss, unspecified ear: Secondary | ICD-10-CM | POA: Diagnosis not present

## 2016-08-14 DIAGNOSIS — K219 Gastro-esophageal reflux disease without esophagitis: Secondary | ICD-10-CM | POA: Diagnosis present

## 2016-08-14 DIAGNOSIS — I129 Hypertensive chronic kidney disease with stage 1 through stage 4 chronic kidney disease, or unspecified chronic kidney disease: Secondary | ICD-10-CM | POA: Diagnosis present

## 2016-08-14 DIAGNOSIS — K5669 Other partial intestinal obstruction: Secondary | ICD-10-CM | POA: Diagnosis not present

## 2016-08-14 DIAGNOSIS — Z800 Family history of malignant neoplasm of digestive organs: Secondary | ICD-10-CM

## 2016-08-14 DIAGNOSIS — Z87891 Personal history of nicotine dependence: Secondary | ICD-10-CM | POA: Diagnosis not present

## 2016-08-14 DIAGNOSIS — R109 Unspecified abdominal pain: Secondary | ICD-10-CM | POA: Diagnosis not present

## 2016-08-14 DIAGNOSIS — R1013 Epigastric pain: Secondary | ICD-10-CM | POA: Diagnosis present

## 2016-08-14 HISTORY — DX: Partial intestinal obstruction, unspecified as to cause: K56.600

## 2016-08-14 HISTORY — DX: Disorder of kidney and ureter, unspecified: N28.9

## 2016-08-14 LAB — URINALYSIS, ROUTINE W REFLEX MICROSCOPIC
Bilirubin Urine: NEGATIVE
Glucose, UA: NEGATIVE mg/dL
Hgb urine dipstick: NEGATIVE
Ketones, ur: NEGATIVE mg/dL
LEUKOCYTES UA: NEGATIVE
NITRITE: NEGATIVE
PROTEIN: NEGATIVE mg/dL
pH: 7 (ref 5.0–8.0)

## 2016-08-14 LAB — COMPREHENSIVE METABOLIC PANEL
ALK PHOS: 58 U/L (ref 38–126)
ALT: 17 U/L (ref 17–63)
ANION GAP: 7 (ref 5–15)
AST: 18 U/L (ref 15–41)
Albumin: 4 g/dL (ref 3.5–5.0)
BUN: 22 mg/dL — ABNORMAL HIGH (ref 6–20)
CALCIUM: 9.2 mg/dL (ref 8.9–10.3)
CO2: 26 mmol/L (ref 22–32)
CREATININE: 1.36 mg/dL — AB (ref 0.61–1.24)
Chloride: 109 mmol/L (ref 101–111)
GFR, EST AFRICAN AMERICAN: 58 mL/min — AB (ref >60)
GFR, EST NON AFRICAN AMERICAN: 50 mL/min — AB (ref >60)
Glucose, Bld: 100 mg/dL — ABNORMAL HIGH (ref 65–99)
Potassium: 4.2 mmol/L (ref 3.5–5.1)
SODIUM: 142 mmol/L (ref 135–145)
Total Bilirubin: 0.5 mg/dL (ref 0.3–1.2)
Total Protein: 6.7 g/dL (ref 6.5–8.1)

## 2016-08-14 LAB — I-STAT TROPONIN, ED: TROPONIN I, POC: 0.01 ng/mL (ref 0.00–0.08)

## 2016-08-14 LAB — CBC
HCT: 44.7 % (ref 39.0–52.0)
HEMOGLOBIN: 15.1 g/dL (ref 13.0–17.0)
MCH: 28.5 pg (ref 26.0–34.0)
MCHC: 33.8 g/dL (ref 30.0–36.0)
MCV: 84.3 fL (ref 78.0–100.0)
PLATELETS: 168 10*3/uL (ref 150–400)
RBC: 5.3 MIL/uL (ref 4.22–5.81)
RDW: 13.9 % (ref 11.5–15.5)
WBC: 7.4 10*3/uL (ref 4.0–10.5)

## 2016-08-14 LAB — LIPASE, BLOOD: LIPASE: 28 U/L (ref 11–51)

## 2016-08-14 LAB — LACTIC ACID, PLASMA: LACTIC ACID, VENOUS: 1.1 mmol/L (ref 0.5–1.9)

## 2016-08-14 MED ORDER — ACETAMINOPHEN 325 MG PO TABS: 650.0000 mg | ORAL_TABLET | Freq: Four times a day (QID) | ORAL | Status: DC | PRN

## 2016-08-14 MED ORDER — ACETAMINOPHEN 650 MG RE SUPP: 650.0000 mg | Freq: Four times a day (QID) | RECTAL | Status: DC | PRN

## 2016-08-14 MED ORDER — ONDANSETRON HCL 4 MG/2ML IJ SOLN: 4.0000 mg | Freq: Four times a day (QID) | INTRAMUSCULAR | Status: DC | PRN

## 2016-08-14 MED ORDER — FLUTICASONE PROPIONATE 50 MCG/ACT NA SUSP
2.0000 | Freq: Every day | NASAL | Status: DC | PRN
  Filled 2016-08-14: qty 16

## 2016-08-14 MED ORDER — ALBUTEROL SULFATE (2.5 MG/3ML) 0.083% IN NEBU: 2.5000 mg | INHALATION_SOLUTION | Freq: Four times a day (QID) | RESPIRATORY_TRACT | Status: DC | PRN

## 2016-08-14 MED ORDER — POTASSIUM CHLORIDE IN NACL 20-0.9 MEQ/L-% IV SOLN
INTRAVENOUS | Status: DC
  Administered 2016-08-14 – 2016-08-16 (×5): via INTRAVENOUS
  Filled 2016-08-14 (×7): qty 1000

## 2016-08-14 MED ORDER — IOPAMIDOL (ISOVUE-300) INJECTION 61%
INTRAVENOUS | Status: AC
  Administered 2016-08-14: 100 mL
  Filled 2016-08-14: qty 100

## 2016-08-14 MED ORDER — SODIUM CHLORIDE 0.9 % IV SOLN: INTRAVENOUS | Status: DC

## 2016-08-14 MED ORDER — FAMOTIDINE IN NACL 20-0.9 MG/50ML-% IV SOLN
20.0000 mg | Freq: Two times a day (BID) | INTRAVENOUS | Status: DC
  Administered 2016-08-14 – 2016-08-17 (×7): 20 mg via INTRAVENOUS
  Filled 2016-08-14 (×9): qty 50

## 2016-08-14 MED ORDER — ENOXAPARIN SODIUM 40 MG/0.4ML ~~LOC~~ SOLN
40.0000 mg | SUBCUTANEOUS | Status: DC
  Administered 2016-08-14 – 2016-08-16 (×3): 40 mg via SUBCUTANEOUS
  Filled 2016-08-14 (×3): qty 0.4

## 2016-08-14 MED ORDER — KETOROLAC TROMETHAMINE 15 MG/ML IJ SOLN
15.0000 mg | Freq: Four times a day (QID) | INTRAMUSCULAR | Status: DC | PRN
  Administered 2016-08-14: 15 mg via INTRAVENOUS
  Filled 2016-08-14: qty 1

## 2016-08-14 NOTE — Progress Notes (Signed)
Subjective:    Patient ID: Larry Butler, male    DOB: 09/02/1941, 74 y.o.   MRN: [id number]  HPI Patient awoke this morning around 3 AM suddenly with chest pressure. He denies any shortness of breath but he did complain of gaseous distention of his abdomen. He was belching constantly. He felt nauseated like he needed to throw up. Here today on encounter he is belching constantly throughout the exam. His abdomen is distended. He has hyperactive bowel sounds. There is no rebound although he does have guarding. EKG shows normal sinus rhythm with no is evidence of ischemia or infarction.   Past Medical History:  Diagnosis Date  . Arthritis   . Asthma    rarely uses inhalers/ followed by PCP  . Cataract    REMOVED  . Chronic kidney disease   . COPD (chronic obstructive pulmonary disease) ([place])   . GERD (gastroesophageal reflux disease)   . Gout   . HOH (hard of hearing)   . Hypertension   . PONV (postoperative nausea and vomiting)   . Shortness of breath dyspnea    04/01/16  states no changes- "with exertion"   Past Surgical History:  Procedure Laterality Date  . APPENDECTOMY    . BACK SURGERY     fusion 1980's  . EYE SURGERY Bilateral    bliateral IOL with lens implant  . KNEE ARTHROSCOPY Right 04/08/2016   Procedure: RIGHT ARTHROSCOPY KNEE WITH MENISCAL DEBRIDEMENT, AND CONDROPLASTY;  Surgeon: Gaynelle Arabian, MD;  Location: [facility];  Service: Orthopedics;  Laterality: Right;  . PROSTATE SURGERY    . TONSILECTOMY/ADENOIDECTOMY WITH MYRINGOTOMY     Current Outpatient Prescriptions on File Prior to Visit  Medication Sig Dispense Refill  . acetaminophen (TYLENOL) 500 MG tablet Take 500-1,000 mg by mouth every 6 (six) hours as needed (For pain.).    Marland Kitchen albuterol (PROVENTIL HFA;VENTOLIN HFA) 108 (90 BASE) MCG/ACT inhaler Inhale 2 puffs into the lungs every 6 (six) hours as needed for wheezing or shortness of breath. 1 Inhaler 5  . allopurinol (ZYLOPRIM) 300 MG tablet TAKE HALF A  TABLET (150MG ) BY MOUTH DAILY. 30 tablet 5  . amLODipine (NORVASC) 2.5 MG tablet Take 1 tablet (2.5 mg total) by mouth daily after supper. 90 tablet 3  . aspirin EC 81 MG tablet Take 81 mg by mouth daily.    Marland Kitchen bismuth subsalicylate (PEPTO BISMOL) 262 MG chewable tablet Chew 524 mg by mouth as needed. Per dentist recommendations- Last dose 03/29/16    . esomeprazole (NEXIUM) 40 MG capsule TAKE 1 CAPSULE(40 MG) BY MOUTH DAILY AS NEEDED 30 capsule 0  . fluticasone (FLONASE) 50 MCG/ACT nasal spray Place 2 sprays into both nostrils daily as needed for allergies.     . Polyvinyl Alcohol-Povidone (REFRESH OP) Place 2 drops into both eyes daily as needed (For dry eyes.).    Marland Kitchen Probiotic Product (PROBIOTIC ADVANCED PO) Take 1 capsule by mouth daily.     Current Facility-Administered Medications on File Prior to Visit  Medication Dose Route Frequency Provider Last Rate Last Dose  . 0.9 %  sodium chloride infusion  500 mL Intravenous Continuous Milus Banister, MD       No Known Allergies Social History   Social History  . Marital status: Married    Spouse name: N/A  . Number of children: N/A  . Years of education: N/A   Occupational History  . Not on file.   Social History Main Topics  . Smoking status: Former  Smoker    Quit date: 09/03/1977  . Smokeless tobacco: Never Used  . Alcohol use No  . Drug use: No  . Sexual activity: Yes   Other Topics Concern  . Not on file   Social History Narrative   One kind of adopted daughter.        Review of Systems  All other systems reviewed and are negative.      Objective:   Physical Exam  Cardiovascular: Normal rate, regular rhythm and normal heart sounds.   No murmur heard. Pulmonary/Chest: Effort normal and breath sounds normal. No respiratory distress. He has no wheezes. He has no rales.  Abdominal: Soft. He exhibits distension. He exhibits no mass. Bowel sounds are increased. There is tenderness. There is guarding. There is no  rigidity and no rebound.  Vitals reviewed.         Assessment & Plan:  Chest pain, unspecified type - Plan: EKG 12-Lead  Pain of upper abdomen - Plan: DG Abd Acute W/Chest  I believe the patient has a small bowel obstruction. EKG today is completely normal. He is constantly burping. Also the patient for an abdominal series to evaluate for small bowel obstruction versus partial bowel obstruction. If suspicions are confirmed, he will need NG tube decompression.

## 2016-08-14 NOTE — H&P (Signed)
History and Physical    Larry Butler [id number] DOB: 08-Oct-1941 DOA: 08/14/2016   PCP: Odette Fraction, MD   Patient coming from/Resides with: Private residence/lives with wife  Admission status: Inpatient/surgical floor -medically necessary to stay a minimum 2 midnights to rule out impending and/or unexpected changes in physiologic status that may differ from initial evaluation performed in the ER and/or at time of admission. This patient presented with partial small bowel obstruction symptoms confirmed on x-ray and CT now requiring NG tube placement, bowel rest, IV medications for symptom management and IV fluids for hydration. Patient also dehydrated. CT did reveal a transition zone and it is unclear at this time with the patient may require surgical intervention i.e. if this bowel obstruction will clear with conservative bowel rest.  Chief Complaint: Upper abdominal pain  HPI: Larry Butler is a 75 y.o. male with medical history significant for hypertension, asthma, and remote history of appendectomy. Patient reports 5 days of right upper abdominal pain with abdominal fullness bloating and excessive belching. He reports symptoms are specially worse early in the morning with his abdomen being "tight". He essentially has had no flatus for at least 5 days although later this morning he developed a small amount of flatus without stool. In the past he has had similar episodes which have improved at home with bowel rest, ambulation and diet restriction (clears only). He reports waking up at 3 AM today with "worse pain that I've ever had with these episodes". He went to his primary care physician today who sent him for plain films that revealed a partial small bowel obstruction. He was subsequently sent to the ER for further evaluation. CT scan performed in the ER without oral contrast did reveal partial small bowel obstruction as well as moderate right colonic stool burden as well as a  transition zone. NG tube to low wall suction has been ordered by ER physician and request is made for medicine admission.  In further discussion with the patient, he had been having issues dating back to September of having frequent loose stools. He underwent a colonoscopy on 9/12 without any apparent significant abnormal findings and his gastroenterologist started him on Imodium which he has been taking since. He has not noticed any bloody or dark stools. He has had significant nausea since onset of the abdominal pain as described above but has not had any vomiting only excessive belching. He reports this morning experiencing refluxive heaviness in his chest like an elephant sitting on his chest that improved when he sat upright. He denied similar chest pain or any chest pain or shortness of breath with exertion prior to onset of current GI symptoms.  ED Course:  Vital Signs: BP 124/78 (BP Location: Right Arm)   Pulse 63   Temp 97.9 F (36.6 C) (Oral)   Resp 12   SpO2 96%  CT abdomen and pelvis with IV contrast: Mild distended small bowel loops with some air-fluid levels in the mid abdomen and upper pelvis with a transition zone identified-findings highly suspicious for partial small bowel obstruction less likely ileus or enteritis. In addition moderate stool was noted in the right colon and proximal transverse colon. There was also an incidental finding of an indeterminate lesion in the mid pole anterior aspect of the right kidney that measures 8 mm with radiologist's recommending MRI to better clarify to exclude renal cell carcinoma Lab data: Sodium 142, potassium 4.2, chloride 109, CO2 26, BUN 22, creatinine 1.36, glucose 100, LFTs normal,  poc troponin 0.01, WBC 7400 differential not obtained, hemoglobin 15.1, platelets 168,000 Medications and treatments: NG tube ordered but not yet placed (see below)  Review of Systems:  In addition to the HPI above,  No Fever-chills, myalgias or other  constitutional symptoms No Headache, changes with Vision or hearing, new weakness, tingling, numbness in any extremity, dizziness, dysarthria or word finding difficulty, gait disturbance or imbalance, tremors or seizure activity No problems swallowing food or Liquids, choking or coughing while eating No Chest pain, Cough or Shortness of Breath, palpitations, orthopnea or DOE No emesis, melena,hematochezia, dark tarry stools No dysuria, malodorous urine, hematuria or flank pain No new skin rashes, lesions, masses or bruises, No new joint pains, aches, swelling or redness No recent unintentional weight gain or loss No polyuria, polydypsia or polyphagia   Past Medical History:  Diagnosis Date  . Arthritis   . Asthma    rarely uses inhalers/ followed by PCP  . Cataract    REMOVED  . Chronic kidney disease   . COPD (chronic obstructive pulmonary disease) ([place])   . GERD (gastroesophageal reflux disease)   . Gout   . HOH (hard of hearing)   . Hypertension   . PONV (postoperative nausea and vomiting)   . Shortness of breath dyspnea    04/01/16  states no changes- "with exertion"    Past Surgical History:  Procedure Laterality Date  . APPENDECTOMY    . BACK SURGERY     fusion 1980's  . EYE SURGERY Bilateral    bliateral IOL with lens implant  . KNEE ARTHROSCOPY Right 04/08/2016   Procedure: RIGHT ARTHROSCOPY KNEE WITH MENISCAL DEBRIDEMENT, AND CONDROPLASTY;  Surgeon: Gaynelle Arabian, MD;  Location: [facility];  Service: Orthopedics;  Laterality: Right;  . PROSTATE SURGERY    . TONSILECTOMY/ADENOIDECTOMY WITH MYRINGOTOMY      Social History   Social History  . Marital status: Married    Spouse name: N/A  . Number of children: N/A  . Years of education: N/A   Occupational History  . Not on file.   Social History Main Topics  . Smoking status: Former Smoker    Quit date: 09/03/1977  . Smokeless tobacco: Never Used  . Alcohol use No  . Drug use: No  . Sexual activity: Yes    Other Topics Concern  . Not on file   Social History Narrative   One kind of adopted daughter.      Mobility: without assistive devices Work history: Not obtained   No Known Allergies  Family History  Problem Relation Age of Onset  . Cancer Mother     breast  . Stroke Mother   . Cancer Father 38    colon  . Diabetes Sister   . Heart disease Brother 33    CAD, Pacemaker  . Diabetes Brother   . Hearing loss Brother   . Diabetes Brother   . Diabetes Brother   . Diabetes Brother   . Heart disease Brother 43    AVR  . Hearing loss Brother   . Cancer Brother     prostate  . Heart disease Brother   . Hearing loss Brother   . COPD Brother      Prior to Admission medications   Medication Sig Start Date End Date Taking? Authorizing Provider  acetaminophen (TYLENOL) 500 MG tablet Take 500-1,000 mg by mouth every 6 (six) hours as needed (For pain.).   Yes Historical Provider, MD  albuterol (PROVENTIL HFA;VENTOLIN HFA) 108 (90  BASE) MCG/ACT inhaler Inhale 2 puffs into the lungs every 6 (six) hours as needed for wheezing or shortness of breath. 03/07/15  Yes Susy Frizzle, MD  allopurinol (ZYLOPRIM) 300 MG tablet TAKE HALF A TABLET (150MG ) BY MOUTH DAILY. 04/08/16  Yes Gaynelle Arabian, MD  amLODipine (NORVASC) 2.5 MG tablet Take 1 tablet (2.5 mg total) by mouth daily after supper. 04/08/16  Yes Gaynelle Arabian, MD  aspirin EC 81 MG tablet Take 81 mg by mouth daily.   Yes Historical Provider, MD  bismuth subsalicylate (PEPTO BISMOL) 262 MG chewable tablet Chew 524 mg by mouth as needed. Per dentist recommendations- Last dose 03/29/16   Yes Historical Provider, MD  esomeprazole (NEXIUM) 40 MG capsule TAKE 1 CAPSULE(40 MG) BY MOUTH DAILY AS NEEDED 04/14/16  Yes Susy Frizzle, MD  fluticasone [facility]) 50 MCG/ACT nasal spray Place 2 sprays into both nostrils daily as needed for allergies.    Yes Historical Provider, MD  loperamide (IMODIUM) 2 MG capsule Take 2 mg by mouth daily.   Yes  Historical Provider, MD  Polyvinyl Alcohol-Povidone (REFRESH OP) Place 2 drops into both eyes daily as needed (For dry eyes.).   Yes Historical Provider, MD  Probiotic Product (PROBIOTIC ADVANCED PO) Take 1 capsule by mouth daily.    Historical Provider, MD    Physical Exam: Vitals:   08/14/16 1053 08/14/16 1130 08/14/16 1253  BP: 140/82 143/82 124/78  Pulse: 67 63 63  Resp: 16 12 12   Temp: 98.2 F (36.8 C)  97.9 F (36.6 C)  TempSrc: Oral  Oral  SpO2: 97% 97% 96%      Constitutional: NAD, calm, comfortable Eyes: PERRL, lids and conjunctivae normal ENMT: Mucous membranes are moist. Posterior pharynx clear of any exudate or lesions.Normal dentition.  Neck: normal, supple, no masses, no thyromegaly Respiratory: clear to auscultation bilaterally, no wheezing, no crackles. Normal respiratory effort. No accessory muscle use.  Cardiovascular: Regular rate and rhythm, no murmurs / rubs / gallops. No extremity edema. 2+ pedal pulses. No carotid bruits.  Abdomen: no tenderness, Soft and nondistended, no masses palpated. No hepatosplenomegaly. Very hypoactive Bowel sounds positive.  Musculoskeletal: no clubbing / cyanosis. No joint deformity upper and lower extremities. Good ROM, no contractures. Normal muscle tone.  Skin: no rashes, lesions, ulcers. No induration Neurologic: CN 2-12 grossly intact. Sensation intact, DTR normal. Strength 5/5 x all 4 extremities.  Psychiatric: Normal judgment and insight. Alert and oriented x 3. Normal mood.    Labs on Admission: I have personally reviewed following labs and imaging studies  CBC:  Recent Labs Lab 08/14/16 1103  WBC 7.4  HGB 15.1  HCT 44.7  MCV 84.3  PLT [id number]   Basic Metabolic Panel:  Recent Labs Lab 08/14/16 1103  NA 142  K 4.2  CL 109  CO2 26  GLUCOSE 100*  BUN 22*  CREATININE 1.36*  CALCIUM 9.2   GFR: Estimated Creatinine Clearance: 52.5 mL/min (by C-G formula based on SCr of 1.36 mg/dL (H)). Liver Function  Tests:  Recent Labs Lab 08/14/16 1103  AST 18  ALT 17  ALKPHOS 58  BILITOT 0.5  PROT 6.7  ALBUMIN 4.0    Recent Labs Lab 08/14/16 1103  LIPASE 28   No results for input(s): AMMONIA in the last 168 hours. Coagulation Profile: No results for input(s): INR, PROTIME in the last 168 hours. Cardiac Enzymes: No results for input(s): CKTOTAL, CKMB, CKMBINDEX, TROPONINI in the last 168 hours. BNP (last 3 results) No results for input(s): PROBNP  in the last 8760 hours. HbA1C: No results for input(s): HGBA1C in the last 72 hours. CBG: No results for input(s): GLUCAP in the last 168 hours. Lipid Profile: No results for input(s): CHOL, HDL, LDLCALC, TRIG, CHOLHDL, LDLDIRECT in the last 72 hours. Thyroid Function Tests: No results for input(s): TSH, T4TOTAL, FREET4, T3FREE, THYROIDAB in the last 72 hours. Anemia Panel: No results for input(s): VITAMINB12, FOLATE, FERRITIN, TIBC, IRON, RETICCTPCT in the last 72 hours. Urine analysis:    Component Value Date/Time   COLORURINE YELLOW 04/17/2016 [place] 04/17/2016 1206   LABSPEC 1.025 04/17/2016 1206   PHURINE 5.5 04/17/2016 1206   GLUCOSEU NEGATIVE 04/17/2016 1206   HGBUR NEGATIVE 04/17/2016 1206   BILIRUBINUR NEGATIVE 04/17/2016 1206   KETONESUR TRACE (A) 04/17/2016 1206   PROTEINUR 1+ (A) 04/17/2016 1206   NITRITE NEGATIVE 04/17/2016 1206   LEUKOCYTESUR NEGATIVE 04/17/2016 1206   Sepsis Labs: @LABRCNTIP (procalcitonin:4,lacticidven:4) )No results found for this or any previous visit (from the past 240 hour(s)).   Radiological Exams on Admission: Ct Abdomen Pelvis W Contrast  Result Date: 08/14/2016 CLINICAL DATA:  Right side abdominal pain for 5 days, diarrhea, possible small bowel obstruction EXAM: CT ABDOMEN AND PELVIS WITH CONTRAST TECHNIQUE: Multidetector CT imaging of the abdomen and pelvis was performed using the standard protocol following bolus administration of intravenous contrast. CONTRAST:   162mL ISOVUE-300 IOPAMIDOL (ISOVUE-300) INJECTION 61% COMPARISON:  Abdominal x-ray 08/14/2016 FINDINGS: Lower chest: The lungs are bases are unremarkable. Hepatobiliary: Mild fatty infiltration of the liver. Few hepatic cysts are noted the largest in left hepatic lobe measures 1 cm. No calcified gallstones are noted within gallbladder. Pancreas: Enhanced pancreas is unremarkable. Spleen: Enhanced spleen is unremarkable. Adrenals/Urinary Tract: No adrenal gland mass. Enhanced kidneys are symmetrical in size. No hydronephrosis or hydroureter. There is indeterminate lesion mid pole anterior aspect of the right kidney measures about 8 mm. Further correlation with MRI is recommended to exclude a renal cell carcinoma. Delayed renal images shows bilateral renal symmetrical excretion. Bilateral visualized proximal ureter is unremarkable. The urinary bladder is unremarkable. Stomach/Bowel: The study is limited without oral contrast. No gastric outlet obstruction. The the there are dilated small bowel loops with some air-fluid levels in mid abdomen and upper pelvis. Axial image 37 there is transition point in caliber of small bowel. Findings highly suspicious for early small bowel obstruction. Less likely ileus or enteritis. Clinical correlation is necessary. Terminal small bowel is decompressed small caliber. Moderate stool noted in right colon and proximal transverse colon. There is no pericecal inflammation. The appendix is not identified. Colonic diverticula are noted descending colon and sigmoid colon. No evidence of distal colitis or diverticulitis. Vascular/Lymphatic: No aortic aneurysm. Atherosclerotic calcifications of abdominal aorta and iliac arteries. No retroperitoneal or mesenteric adenopathy. Reproductive: Prostate gland seminal vesicles are unremarkable. Other: No ascites or free abdominal air. Bilateral inguinal scrotal canal small hernia containing fat without evidence of acute complication. Musculoskeletal:  No destructive bony lesions are noted. Sagittal images of the spine shows degenerative changes lumbar spine. Degenerative changes bilateral SI joints. IMPRESSION: 1. Mild distended small bowel loops with some air-fluid levels in mid abdomen and upper pelvis. There is transition point in caliber of small bowel in axial image 37. Findings highly suspicious for partial small bowel obstruction. Less likely ileus or enteritis. Clinical correlation is necessary. 2. No ascites or free abdominal air. 3. Moderate stool noted in right colon and proximal transverse colon. No pericecal inflammation. Appendix is not identified. 4. Distal colonic diverticula.  No evidence of acute diverticulitis. 5. There is indeterminate lesion in midpole anterior aspect of the right kidney measures about 8 mm. Further correlation with MRI is recommended to exclude renal cell carcinoma. 6. Mild fatty infiltration of the liver. 7. Degenerative changes lumbar spine. Electronically Signed   By: Lahoma Crocker M.D.   On: 08/14/2016 12:38   Dg Abd Acute W/chest  Result Date: 08/14/2016 CLINICAL DATA:  Abdominal pain and bloating EXAM: DG ABDOMEN ACUTE W/ 1V CHEST COMPARISON:  02/07/2016 FINDINGS: Cardiac shadow is within normal limits. The lungs are well aerated bilaterally. No focal infiltrate or sizable effusion is seen. Scattered large and small bowel gas is noted. Fecal material is noted within the colon. Mild small bowel dilatation is seen. Changes may represent a partial small bowel obstruction. Correlation with physical exam is recommended. No free air is noted. IMPRESSION: Mildly dilated small bowel loops which may represent a small-bowel ileus or partial small bowel obstruction given the air and fecal material within the colon. Correlation with the physical exam is recommended. These results will be called to the ordering clinician or representative by the Radiology Department at the imaging location. Electronically Signed   By: Inez Catalina M.D.   On: 08/14/2016 10:19    EKG: (Independently reviewed) sinus rhythm with ventricular rate 70 bpm, QTC 466 ms, no ST segment or T-wave changes that would be concerning for ischemia, subtle J-point elevation in lead V6  Assessment/Plan Principal Problem:   Partial small bowel obstruction -Patient presents with 5 days of abdominal pain without flatus with history of similar symptoms in the past that have responded to conservative home medical management -Partial small bowel obstruction confirmed on plain films as well as CT of the abdomen -Attempt 2 was made by ER staff to place NG tube but unable to pass tube; abdomen soft and despite recurrent episodic belching patient has not had any emesis so we'll continue to monitor -If patient develops recurrent emesis will need to take to interventional radiology/OP to have NG tube placed by radiologist -NPO except for a few ice chips -IV fluids with potassium at 100 mL per hour -Repeat abdominal film in a.m. -If patient not clinically improved in a.m. on 10/13 (ie improved x-ray, passage of more frequent flatus, no further episodes of abdominal distention or pain) than likely will require surgical evaluation given radiologist interpretation of transition zone on CT -If improved as stated above then patient will need to begin laxative therapies to help relieve moderate colonic stool burden as noted (stool burden may be contributory to PSBO sxs) -PR Tylenol for mild pain in IV Toradol for moderate pain -IV Zofran for nausea -Baseline lactic acid  Active Problems:   Dehydration, mild -IV fluids as described above -Follow electrolytes    HTN (hypertension) -On Norvasc at home which is on hold secondary to NPO/need for bowel rest -Current blood pressure well controlled -Last echocardiogram was in 2013 with normal LVEF    Asthma -Currently asymptomatic -Continue preadmission MDI    GERD (gastroesophageal reflux disease) -IV  Pepcid    Renal lesion (RIGHT) 73mm -Incidental finding on CT abdomen and pelvis -d/w pt and wife; will need eventual MRI to characterize      DVT prophylaxis: Lovenox Code Status: Full Family Communication: Wife at bedside Disposition Plan: Anticipate discharge back preadmission home environment once medically stable Consults called: None but low threshold to consult surgery if not improved within the next 24 hours    Mana Morison L. ANP-BC [facility] Pager [phone]   If 7PM-7AM, please contact night-coverage [email or web address] Password [email or web address]  08/14/2016, 2:45 PM

## 2016-08-14 NOTE — Consult Note (Signed)
Reason for Consult:PSBO Referring Physician: Graycen Sadlon is an 75 y.o. male.  HPI: Joanthony was admitted to the hospitalist service today with a partial small bowel obstruction. He was feeling bloated with R sided abdominal pain for about 5 days prior to coming to the ED. he has had several similar episodes in the past 7 years which have always resolved spontaneously. They have not required hospitalization. Multiple attempts were made to place a nasogastric tube in the emergency department but this could not be placed. He has had nasal surgery in the past. Since admission, he has passed some gas and had a bowel movement this evening. He claims to still feel quite bloated with belching. CT scan of the abdomen and pelvis was consistent with partial small bowel obstruction.  Past Medical History:  Diagnosis Date  . Arthritis   . Asthma    rarely uses inhalers/ followed by PCP  . Cataract    REMOVED  . Chronic kidney disease   . COPD (chronic obstructive pulmonary disease) ([place])   . GERD (gastroesophageal reflux disease)   . Gout   . HOH (hard of hearing)   . Hypertension   . Partial small bowel obstruction 08/14/2016  . PONV (postoperative nausea and vomiting)   . Renal lesion    24m, on right, Incidental finding on CT abdomen and pelvis /notes 08/14/2016  . Shortness of breath dyspnea    04/01/16  states no changes- "with exertion"    Past Surgical History:  Procedure Laterality Date  . APPENDECTOMY    . BACK SURGERY     fusion 1980's  . EYE SURGERY Bilateral    bliateral IOL with lens implant  . KNEE ARTHROSCOPY Right 04/08/2016   Procedure: RIGHT ARTHROSCOPY KNEE WITH MENISCAL DEBRIDEMENT, AND CONDROPLASTY;  Surgeon: FGaynelle Arabian MD;  Location: [facility];  Service: Orthopedics;  Laterality: Right;  . PROSTATE SURGERY    . TONSILECTOMY/ADENOIDECTOMY WITH MYRINGOTOMY      Family History  Problem Relation Age of Onset  . Cancer Mother     breast  . Stroke  Mother   . Cancer Father 775   colon  . Diabetes Sister   . Heart disease Brother 868   CAD, Pacemaker  . Diabetes Brother   . Hearing loss Brother   . Diabetes Brother   . Diabetes Brother   . Diabetes Brother   . Heart disease Brother 541   AVR  . Hearing loss Brother   . Cancer Brother     prostate  . Heart disease Brother   . Hearing loss Brother   . COPD Brother     Social History:  reports that he quit smoking about 38 years ago. He has never used smokeless tobacco. He reports that he does not drink alcohol or use drugs.  Allergies: No Known Allergies  Medications:  Scheduled: . enoxaparin (LOVENOX) injection  40 mg Subcutaneous Q24H  . famotidine (PEPCID) IV  20 mg Intravenous Q12H   Continuous: . 0.9 % NaCl with KCl 20 mEq / L 100 mL/hr at 08/14/16 1520   PGXQ:JJHERDEYCXKGY**OR** acetaminophen, albuterol, fluticasone, ketorolac, ondansetron (ZOFRAN) IV  Results for orders placed or performed during the hospital encounter of 08/14/16 (from the past 48 hour(s))  Lipase, blood     Status: None   Collection Time: 08/14/16 11:03 AM  Result Value Ref Range   Lipase 28 11 - 51 U/L  Comprehensive metabolic panel     Status:  Abnormal   Collection Time: 08/14/16 11:03 AM  Result Value Ref Range   Sodium 142 135 - 145 mmol/L   Potassium 4.2 3.5 - 5.1 mmol/L   Chloride 109 101 - 111 mmol/L   CO2 26 22 - 32 mmol/L   Glucose, Bld 100 (H) 65 - 99 mg/dL   BUN 22 (H) 6 - 20 mg/dL   Creatinine, Ser 1.36 (H) 0.61 - 1.24 mg/dL   Calcium 9.2 8.9 - 10.3 mg/dL   Total Protein 6.7 6.5 - 8.1 g/dL   Albumin 4.0 3.5 - 5.0 g/dL   AST 18 15 - 41 U/L   ALT 17 17 - 63 U/L   Alkaline Phosphatase 58 38 - 126 U/L   Total Bilirubin 0.5 0.3 - 1.2 mg/dL   GFR calc non Af Amer 50 (L) >60 mL/min   GFR calc Af Amer 58 (L) >60 mL/min    Comment: (NOTE) The eGFR has been calculated using the CKD EPI equation. This calculation has not been validated in all clinical situations. eGFR's  persistently <60 mL/min signify possible Chronic Kidney Disease.    Anion gap 7 5 - 15  CBC     Status: None   Collection Time: 08/14/16 11:03 AM  Result Value Ref Range   WBC 7.4 4.0 - 10.5 K/uL   RBC 5.30 4.22 - 5.81 MIL/uL   Hemoglobin 15.1 13.0 - 17.0 g/dL   HCT 44.7 39.0 - 52.0 %   MCV 84.3 78.0 - 100.0 fL   MCH 28.5 26.0 - 34.0 pg   MCHC 33.8 30.0 - 36.0 g/dL   RDW 13.9 11.5 - 15.5 %   Platelets 168 150 - 400 K/uL  I-stat troponin, ED     Status: None   Collection Time: 08/14/16 11:54 AM  Result Value Ref Range   Troponin i, poc 0.01 0.00 - 0.08 ng/mL   Comment 3            Comment: Due to the release kinetics of cTnI, a negative result within the first hours of the onset of symptoms does not rule out myocardial infarction with certainty. If myocardial infarction is still suspected, repeat the test at appropriate intervals.   Lactic acid, plasma     Status: None   Collection Time: 08/14/16  3:11 PM  Result Value Ref Range   Lactic Acid, Venous 1.1 0.5 - 1.9 mmol/L  Urinalysis, Routine w reflex microscopic     Status: Abnormal   Collection Time: 08/14/16  3:15 PM  Result Value Ref Range   Color, Urine YELLOW YELLOW   APPearance CLEAR CLEAR   Specific Gravity, Urine >1.046 (H) 1.005 - 1.030   pH 7.0 5.0 - 8.0   Glucose, UA NEGATIVE NEGATIVE mg/dL   Hgb urine dipstick NEGATIVE NEGATIVE   Bilirubin Urine NEGATIVE NEGATIVE   Ketones, ur NEGATIVE NEGATIVE mg/dL   Protein, ur NEGATIVE NEGATIVE mg/dL   Nitrite NEGATIVE NEGATIVE   Leukocytes, UA NEGATIVE NEGATIVE    Comment: MICROSCOPIC NOT DONE ON URINES WITH NEGATIVE PROTEIN, BLOOD, LEUKOCYTES, NITRITE, OR GLUCOSE <1000 mg/dL.    Ct Abdomen Pelvis W Contrast  Result Date: 08/14/2016 CLINICAL DATA:  Right side abdominal pain for 5 days, diarrhea, possible small bowel obstruction EXAM: CT ABDOMEN AND PELVIS WITH CONTRAST TECHNIQUE: Multidetector CT imaging of the abdomen and pelvis was performed using the standard  protocol following bolus administration of intravenous contrast. CONTRAST:  175m ISOVUE-300 IOPAMIDOL (ISOVUE-300) INJECTION 61% COMPARISON:  Abdominal x-ray 08/14/2016 FINDINGS: Lower chest:  The lungs are bases are unremarkable. Hepatobiliary: Mild fatty infiltration of the liver. Few hepatic cysts are noted the largest in left hepatic lobe measures 1 cm. No calcified gallstones are noted within gallbladder. Pancreas: Enhanced pancreas is unremarkable. Spleen: Enhanced spleen is unremarkable. Adrenals/Urinary Tract: No adrenal gland mass. Enhanced kidneys are symmetrical in size. No hydronephrosis or hydroureter. There is indeterminate lesion mid pole anterior aspect of the right kidney measures about 8 mm. Further correlation with MRI is recommended to exclude a renal cell carcinoma. Delayed renal images shows bilateral renal symmetrical excretion. Bilateral visualized proximal ureter is unremarkable. The urinary bladder is unremarkable. Stomach/Bowel: The study is limited without oral contrast. No gastric outlet obstruction. The the there are dilated small bowel loops with some air-fluid levels in mid abdomen and upper pelvis. Axial image 37 there is transition point in caliber of small bowel. Findings highly suspicious for early small bowel obstruction. Less likely ileus or enteritis. Clinical correlation is necessary. Terminal small bowel is decompressed small caliber. Moderate stool noted in right colon and proximal transverse colon. There is no pericecal inflammation. The appendix is not identified. Colonic diverticula are noted descending colon and sigmoid colon. No evidence of distal colitis or diverticulitis. Vascular/Lymphatic: No aortic aneurysm. Atherosclerotic calcifications of abdominal aorta and iliac arteries. No retroperitoneal or mesenteric adenopathy. Reproductive: Prostate gland seminal vesicles are unremarkable. Other: No ascites or free abdominal air. Bilateral inguinal scrotal canal small  hernia containing fat without evidence of acute complication. Musculoskeletal: No destructive bony lesions are noted. Sagittal images of the spine shows degenerative changes lumbar spine. Degenerative changes bilateral SI joints. IMPRESSION: 1. Mild distended small bowel loops with some air-fluid levels in mid abdomen and upper pelvis. There is transition point in caliber of small bowel in axial image 37. Findings highly suspicious for partial small bowel obstruction. Less likely ileus or enteritis. Clinical correlation is necessary. 2. No ascites or free abdominal air. 3. Moderate stool noted in right colon and proximal transverse colon. No pericecal inflammation. Appendix is not identified. 4. Distal colonic diverticula.  No evidence of acute diverticulitis. 5. There is indeterminate lesion in midpole anterior aspect of the right kidney measures about 8 mm. Further correlation with MRI is recommended to exclude renal cell carcinoma. 6. Mild fatty infiltration of the liver. 7. Degenerative changes lumbar spine. Electronically Signed   By: Lahoma Crocker M.D.   On: 08/14/2016 12:38   Dg Abd Acute W/chest  Result Date: 08/14/2016 CLINICAL DATA:  Abdominal pain and bloating EXAM: DG ABDOMEN ACUTE W/ 1V CHEST COMPARISON:  02/07/2016 FINDINGS: Cardiac shadow is within normal limits. The lungs are well aerated bilaterally. No focal infiltrate or sizable effusion is seen. Scattered large and small bowel gas is noted. Fecal material is noted within the colon. Mild small bowel dilatation is seen. Changes may represent a partial small bowel obstruction. Correlation with physical exam is recommended. No free air is noted. IMPRESSION: Mildly dilated small bowel loops which may represent a small-bowel ileus or partial small bowel obstruction given the air and fecal material within the colon. Correlation with the physical exam is recommended. These results will be called to the ordering clinician or representative by the  Radiology Department at the imaging location. Electronically Signed   By: Inez Catalina M.D.   On: 08/14/2016 10:19    Review of Systems  Constitutional: Negative for fever.  Eyes: Negative for blurred vision.  Respiratory: Negative for cough and shortness of breath.   Cardiovascular: Negative for chest pain.  Gastrointestinal: Positive for abdominal pain, constipation and nausea. Negative for vomiting.  Genitourinary: Negative.   Musculoskeletal: Negative.   Skin: Negative.   Neurological: Negative.  Negative for headaches.  Endo/Heme/Allergies: Negative.   Psychiatric/Behavioral: Negative.    Blood pressure 120/60, pulse 70, temperature 97.7 F (36.5 C), temperature source Oral, resp. rate 18, height 5' 7"  (1.702 m), weight 95.3 kg (210 lb), SpO2 98 %. Physical Exam  Constitutional: He is oriented to person, place, and time. He appears well-developed and well-nourished. No distress.  HENT:  Head: Normocephalic.  Right Ear: External ear normal.  Left Ear: External ear normal.  Mouth/Throat: Oropharynx is clear and moist.  Eyes: Conjunctivae and EOM are normal. Pupils are equal, round, and reactive to light.  Neck: Neck supple. No tracheal deviation present.  Cardiovascular: Normal rate, regular rhythm and normal heart sounds.   Respiratory: Effort normal and breath sounds normal. No stridor. No respiratory distress. He has no wheezes.  GI: Soft. He exhibits distension. There is no tenderness. There is no rebound and no guarding.  Distended but soft, no significant tenderness, bowel sounds are present  Musculoskeletal: He exhibits no edema.  Neurological: He is alert and oriented to person, place, and time.  Skin: Skin is warm.  Psychiatric: He has a normal mood and affect.    Assessment/Plan: PSBO - agree with bowel rest and IV fluids. Follow-up abdominal x-ray in a.m. He is passing some gas and did have a bowel movement but remains distended. He may be a candidate for our small  bowel protocol pending his morning x-ray. We will follow along with you.  Alyannah Sanks E 08/14/2016, 9:52 PM

## 2016-08-14 NOTE — ED Notes (Signed)
MD at bedside. 

## 2016-08-14 NOTE — ED Triage Notes (Signed)
Pt reports 5 day hx of right upper abdominal pain, fullness, burping. Seen by PMD this AM had xrays which showed intestinal blockage. Denies recent fever. Pt reports hx of abdominal surgery. Poor appetite. States feels like he has an elephant sitting on his chest. VSs.

## 2016-08-14 NOTE — ED Notes (Addendum)
Spoke with Ebony Hail NP to notify of attempts x2 for NG tube. Unsuccessful. She is now at the bedside discussing with patient and wife.

## 2016-08-14 NOTE — ED Provider Notes (Signed)
[place] DEPT Provider Note   CSN: [id number] Arrival date & time: 08/14/16  1044     History   Chief Complaint Chief Complaint  Patient presents with  . Abdominal Pain    HPI Larry Butler is a 75 y.o. male.  HPI  75 year old male with a history of hypertension, CKD, COPD, who had appendectomy approximately 50 years ago presents to the ED with abdominal distention and pain. Patient seen by PCP who obtained an acute abdominal series does concerning for possible small bowel obstruction. Patient's symptoms started approximately one week ago and has not improved. This episode is similar to previous episodes however they usually last only one to 2 days. He reports that he has not had a bowel movement or passed gas since Monday. He endorses some nausea without emesis. Patient does report nonradiating chest pressure which is relieved by belching.  No prior cardiac events. Patient reports prior catheterization without any stents placement.  Past Medical History:  Diagnosis Date  . Arthritis   . Asthma    rarely uses inhalers/ followed by PCP  . Cataract    REMOVED  . Chronic kidney disease   . COPD (chronic obstructive pulmonary disease) ([place])   . GERD (gastroesophageal reflux disease)   . Gout   . HOH (hard of hearing)   . Hypertension   . PONV (postoperative nausea and vomiting)   . Shortness of breath dyspnea    04/01/16  states no changes- "with exertion"    Patient Active Problem List   Diagnosis Date Noted  . Acute medial meniscal tear 04/08/2016  . 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 APPENDECTOMY    . BACK SURGERY     fusion 1980's  . EYE SURGERY Bilateral    bliateral IOL with lens implant  . KNEE ARTHROSCOPY Right 04/08/2016   Procedure: RIGHT ARTHROSCOPY KNEE WITH MENISCAL DEBRIDEMENT, AND CONDROPLASTY;  Surgeon: Gaynelle Arabian, MD;  Location: [facility] ORS;  Service: Orthopedics;  Laterality: Right;  .  PROSTATE SURGERY    . TONSILECTOMY/ADENOIDECTOMY WITH MYRINGOTOMY         Home Medications    Prior to Admission medications   Medication Sig Start Date End Date Taking? Authorizing Provider  acetaminophen (TYLENOL) 500 MG tablet Take 500-1,000 mg by mouth every 6 (six) hours as needed (For pain.).   Yes Historical Provider, MD  albuterol (PROVENTIL HFA;VENTOLIN HFA) 108 (90 BASE) MCG/ACT inhaler Inhale 2 puffs into the lungs every 6 (six) hours as needed for wheezing or shortness of breath. 03/07/15  Yes Susy Frizzle, MD  allopurinol (ZYLOPRIM) 300 MG tablet TAKE HALF A TABLET (150MG ) BY MOUTH DAILY. 04/08/16  Yes Gaynelle Arabian, MD  amLODipine (NORVASC) 2.5 MG tablet Take 1 tablet (2.5 mg total) by mouth daily after supper. 04/08/16  Yes Gaynelle Arabian, MD  aspirin EC 81 MG tablet Take 81 mg by mouth daily.   Yes Historical Provider, MD  bismuth subsalicylate (PEPTO BISMOL) 262 MG chewable tablet Chew 524 mg by mouth as needed. Per dentist recommendations- Last dose 03/29/16   Yes Historical Provider, MD  esomeprazole (NEXIUM) 40 MG capsule TAKE 1 CAPSULE(40 MG) BY MOUTH DAILY AS NEEDED 04/14/16  Yes Susy Frizzle, MD  fluticasone [facility]) 50 MCG/ACT nasal spray Place 2 sprays into both nostrils daily as needed for allergies.    Yes Historical Provider, MD  loperamide (IMODIUM) 2 MG capsule Take 2 mg by mouth daily.  Yes Historical Provider, MD  Polyvinyl Alcohol-Povidone (REFRESH OP) Place 2 drops into both eyes daily as needed (For dry eyes.).   Yes Historical Provider, MD  Probiotic Product (PROBIOTIC ADVANCED PO) Take 1 capsule by mouth daily.    Historical Provider, MD    Family History Family History  Problem Relation Age of Onset  . Cancer Mother     breast  . Stroke Mother   . Cancer Father 74    colon  . Diabetes Sister   . Heart disease Brother 20    CAD, Pacemaker  . Diabetes Brother   . Hearing loss Brother   . Diabetes Brother   . Diabetes Brother   . Diabetes  Brother   . Heart disease Brother 33    AVR  . Hearing loss Brother   . Cancer Brother     prostate  . Heart disease Brother   . Hearing loss Brother   . COPD Brother     Social History Social History  Substance Use Topics  . Smoking status: Former Smoker    Quit date: 09/03/1977  . Smokeless tobacco: Never Used  . Alcohol use No     Allergies   Review of patient's allergies indicates no known allergies.   Review of Systems Review of Systems Ten systems are reviewed and are negative for acute change except as noted in the HPI   Physical Exam Updated Vital Signs BP 124/78 (BP Location: Right Arm)   Pulse 63   Temp 97.9 F (36.6 C) (Oral)   Resp 12   SpO2 96%   Physical Exam  Constitutional: He is oriented to person, place, and time. He appears well-developed and well-nourished. No distress.  HENT:  Head: Normocephalic and atraumatic.  Nose: Nose normal.  Eyes: Conjunctivae and EOM are normal. Pupils are equal, round, and reactive to light. Right eye exhibits no discharge. Left eye exhibits no discharge. No scleral icterus.  Neck: Normal range of motion. Neck supple.  Cardiovascular: Normal rate and regular rhythm.  Exam reveals no gallop and no friction rub.   No murmur heard. Pulmonary/Chest: Effort normal and breath sounds normal. No stridor. No respiratory distress. He has no rales.  Abdominal: Soft. He exhibits distension. There is tenderness in the epigastric area and periumbilical area. There is no rigidity, no rebound, no guarding and no CVA tenderness.  Musculoskeletal: He exhibits no edema or tenderness.  Neurological: He is alert and oriented to person, place, and time.  Skin: Skin is warm and dry. No rash noted. He is not diaphoretic. No erythema.  Psychiatric: He has a normal mood and affect.  Vitals reviewed.    ED Treatments / Results  Labs (all labs ordered are listed, but only abnormal results are displayed) Labs Reviewed  COMPREHENSIVE  METABOLIC PANEL - Abnormal; Notable for the following:       Result Value   Glucose, Bld 100 (*)    BUN 22 (*)    Creatinine, Ser 1.36 (*)    GFR calc non Af Amer 50 (*)    GFR calc Af Amer 58 (*)    All other components within normal limits  URINALYSIS, ROUTINE W REFLEX MICROSCOPIC (NOT AT [facility]) - Abnormal; Notable for the following:    Specific Gravity, Urine >1.046 (*)    All other components within normal limits  LIPASE, BLOOD  CBC  LACTIC ACID, PLASMA  BASIC METABOLIC PANEL  CBC  I-STAT TROPOININ, ED    EKG  EKG Interpretation  Date/Time:  Thursday August 14 2016 11:00:11 EDT Ventricular Rate:  70 PR Interval:  158 QRS Duration: 90 QT Interval:  432 QTC Calculation: 466 R Axis:   55 Text Interpretation:  Normal sinus rhythm Normal ECG No comparison Confirmed by [facility] MD, PEDRO ([id number]) on 08/14/2016 11:54:47 AM       Radiology Ct Abdomen Pelvis W Contrast  Result Date: 08/14/2016 CLINICAL DATA:  Right side abdominal pain for 5 days, diarrhea, possible small bowel obstruction EXAM: CT ABDOMEN AND PELVIS WITH CONTRAST TECHNIQUE: Multidetector CT imaging of the abdomen and pelvis was performed using the standard protocol following bolus administration of intravenous contrast. CONTRAST:  140mL ISOVUE-300 IOPAMIDOL (ISOVUE-300) INJECTION 61% COMPARISON:  Abdominal x-ray 08/14/2016 FINDINGS: Lower chest: The lungs are bases are unremarkable. Hepatobiliary: Mild fatty infiltration of the liver. Few hepatic cysts are noted the largest in left hepatic lobe measures 1 cm. No calcified gallstones are noted within gallbladder. Pancreas: Enhanced pancreas is unremarkable. Spleen: Enhanced spleen is unremarkable. Adrenals/Urinary Tract: No adrenal gland mass. Enhanced kidneys are symmetrical in size. No hydronephrosis or hydroureter. There is indeterminate lesion mid pole anterior aspect of the right kidney measures about 8 mm. Further correlation with MRI is recommended to exclude a  renal cell carcinoma. Delayed renal images shows bilateral renal symmetrical excretion. Bilateral visualized proximal ureter is unremarkable. The urinary bladder is unremarkable. Stomach/Bowel: The study is limited without oral contrast. No gastric outlet obstruction. The the there are dilated small bowel loops with some air-fluid levels in mid abdomen and upper pelvis. Axial image 37 there is transition point in caliber of small bowel. Findings highly suspicious for early small bowel obstruction. Less likely ileus or enteritis. Clinical correlation is necessary. Terminal small bowel is decompressed small caliber. Moderate stool noted in right colon and proximal transverse colon. There is no pericecal inflammation. The appendix is not identified. Colonic diverticula are noted descending colon and sigmoid colon. No evidence of distal colitis or diverticulitis. Vascular/Lymphatic: No aortic aneurysm. Atherosclerotic calcifications of abdominal aorta and iliac arteries. No retroperitoneal or mesenteric adenopathy. Reproductive: Prostate gland seminal vesicles are unremarkable. Other: No ascites or free abdominal air. Bilateral inguinal scrotal canal small hernia containing fat without evidence of acute complication. Musculoskeletal: No destructive bony lesions are noted. Sagittal images of the spine shows degenerative changes lumbar spine. Degenerative changes bilateral SI joints. IMPRESSION: 1. Mild distended small bowel loops with some air-fluid levels in mid abdomen and upper pelvis. There is transition point in caliber of small bowel in axial image 37. Findings highly suspicious for partial small bowel obstruction. Less likely ileus or enteritis. Clinical correlation is necessary. 2. No ascites or free abdominal air. 3. Moderate stool noted in right colon and proximal transverse colon. No pericecal inflammation. Appendix is not identified. 4. Distal colonic diverticula.  No evidence of acute diverticulitis. 5.  There is indeterminate lesion in midpole anterior aspect of the right kidney measures about 8 mm. Further correlation with MRI is recommended to exclude renal cell carcinoma. 6. Mild fatty infiltration of the liver. 7. Degenerative changes lumbar spine. Electronically Signed   By: Lahoma Crocker M.D.   On: 08/14/2016 12:38   Dg Abd Acute W/chest  Result Date: 08/14/2016 CLINICAL DATA:  Abdominal pain and bloating EXAM: DG ABDOMEN ACUTE W/ 1V CHEST COMPARISON:  02/07/2016 FINDINGS: Cardiac shadow is within normal limits. The lungs are well aerated bilaterally. No focal infiltrate or sizable effusion is seen. Scattered large and small bowel gas is noted. Fecal material is noted within  the colon. Mild small bowel dilatation is seen. Changes may represent a partial small bowel obstruction. Correlation with physical exam is recommended. No free air is noted. IMPRESSION: Mildly dilated small bowel loops which may represent a small-bowel ileus or partial small bowel obstruction given the air and fecal material within the colon. Correlation with the physical exam is recommended. These results will be called to the ordering clinician or representative by the Radiology Department at the imaging location. Electronically Signed   By: Inez Catalina M.D.   On: 08/14/2016 10:19    Procedures Procedures (including critical care time)  Medications Ordered in ED Medications  iopamidol (ISOVUE-300) 61 % injection (100 mLs  Contrast Given 08/14/16 1210)     Initial Impression / Assessment and Plan / ED Course  I have reviewed the triage vital signs and the nursing notes.  Pertinent labs & imaging results that were available during my care of the patient were reviewed by me and considered in my medical decision making (see chart for details).  Clinical Course    1. abd distension and pain Workup consistent with a partial small bowel obstruction. NG tube placed. Patient will be admitted to hospitalist team for  continued management. They will consult surgery as needed if patient does not improve.  2. Chest pressure Atypical and likely secondary to abdominal distention and dilated loops of bowel. EKG without acute ischemia or evidence of pericarditis. Initial troponin negative. Presentation is highly inconsistent with ACS. Low suspicion for pulmonary embolism, Aortic dissection, or esophageal perforation.    Final Clinical Impressions(s) / ED Diagnoses   Final diagnoses:  Partial small bowel obstruction    Disposition: Admit  Condition: stable     Fatima Blank, MD 08/14/16 1811

## 2016-08-14 NOTE — Telephone Encounter (Signed)
Received call from [place] with results of Abd X-ray.   Impression noted with mildly dilated small bowel loops which may represent small bowel ileus or obstruction.   MD made aware and NO given to have patient evaluated in ER. Call placed to patient wife, Gregary Signs. Verbalized understanding and states they will be going to [facility].   Call placed to [facility] ED and Anderson Malta, RN given report.

## 2016-08-14 NOTE — ED Notes (Signed)
Pt denies chest pain but when pain comes he feels slightly short of breath. Currently laying silently in bed. Rise and fall equal and unlabored. NAD. States he has been taking antacids.

## 2016-08-15 ENCOUNTER — Inpatient Hospital Stay (HOSPITAL_COMMUNITY): Payer: Medicare Other

## 2016-08-15 DIAGNOSIS — J4520 Mild intermittent asthma, uncomplicated: Secondary | ICD-10-CM

## 2016-08-15 DIAGNOSIS — K56600 Partial intestinal obstruction, unspecified as to cause: Principal | ICD-10-CM

## 2016-08-15 DIAGNOSIS — I10 Essential (primary) hypertension: Secondary | ICD-10-CM

## 2016-08-15 DIAGNOSIS — K210 Gastro-esophageal reflux disease with esophagitis: Secondary | ICD-10-CM

## 2016-08-15 LAB — BASIC METABOLIC PANEL
Anion gap: 7 (ref 5–15)
BUN: 23 mg/dL — AB (ref 6–20)
CHLORIDE: 110 mmol/L (ref 101–111)
CO2: 25 mmol/L (ref 22–32)
Calcium: 8.8 mg/dL — ABNORMAL LOW (ref 8.9–10.3)
Creatinine, Ser: 1.35 mg/dL — ABNORMAL HIGH (ref 0.61–1.24)
GFR calc Af Amer: 58 mL/min — ABNORMAL LOW (ref >60)
GFR calc non Af Amer: 50 mL/min — ABNORMAL LOW (ref >60)
Glucose, Bld: 91 mg/dL (ref 65–99)
POTASSIUM: 4 mmol/L (ref 3.5–5.1)
SODIUM: 142 mmol/L (ref 135–145)

## 2016-08-15 LAB — CBC
HEMATOCRIT: 41.6 % (ref 39.0–52.0)
Hemoglobin: 13.9 g/dL (ref 13.0–17.0)
MCH: 28.1 pg (ref 26.0–34.0)
MCHC: 33.4 g/dL (ref 30.0–36.0)
MCV: 84.2 fL (ref 78.0–100.0)
Platelets: 154 10*3/uL (ref 150–400)
RBC: 4.94 MIL/uL (ref 4.22–5.81)
RDW: 13.9 % (ref 11.5–15.5)
WBC: 5.8 10*3/uL (ref 4.0–10.5)

## 2016-08-15 MED ORDER — BISACODYL 10 MG RE SUPP
10.0000 mg | Freq: Once | RECTAL | Status: AC
  Administered 2016-08-15: 10 mg via RECTAL
  Filled 2016-08-15: qty 1

## 2016-08-15 MED ORDER — AMLODIPINE BESYLATE 2.5 MG PO TABS
2.5000 mg | ORAL_TABLET | Freq: Every day | ORAL | Status: DC
  Administered 2016-08-15 – 2016-08-17 (×3): 2.5 mg via ORAL
  Filled 2016-08-15 (×3): qty 1

## 2016-08-15 NOTE — Progress Notes (Signed)
Subjective: Reports an uneventful night. Minimal abdominal pain. No N/V and both flatus with a BM at 5am this morning. States he is very hungry and would like something to eat.   Objective: Vital signs in last 24 hours: Temp:  [97.7 F (36.5 C)-98.1 F (36.7 C)] 98.1 F (36.7 C) (10/13 0529) Pulse Rate:  [63-70] 63 (10/13 0529) Resp:  [12-19] 18 (10/13 0529) BP: (120-143)/(55-86) 127/71 (10/13 0529) SpO2:  [95 %-98 %] 97 % (10/13 0529) Weight:  [95.3 kg (210 lb)] 95.3 kg (210 lb) (10/12 1555) Last BM Date: 08/14/16  Intake/Output from previous day: 10/12 0701 - 10/13 0700 In: 1266.7 [I.V.:1166.7; IV Piggyback:100] Out: 351 [Urine:350; Stool:1] Intake/Output this shift: Total I/O In: 0  Out: 300 [Urine:300]  PE: General: pleasant, WD, WN white male who is laying in bed in NAD HEENT: head is normocephalic, atraumatic.  Sclera are noninjected.  PERRL.  Mouth is pink and moist Heart: regular, rate, and rhythm.  Normal s1,s2. No obvious murmurs, gallops, or rubs noted.   Lungs: CTAB, no wheezes, rhonchi, or rales noted.  Respiratory effort nonlabored Abd: soft, small area of tenderness with deep palpation to the right of the umbilicus, +mild distension, +BS, no masses, hernias, or organomegaly MS: all 4 extremities are symmetrical with no cyanosis, clubbing, or edema. Skin: warm and dry with no masses, lesions, or rashes Psych: A&Ox3 with an appropriate affect.  Lab Results:   Recent Labs  08/14/16 1103 08/15/16 0534  WBC 7.4 5.8  HGB 15.1 13.9  HCT 44.7 41.6  PLT 168 154   BMET  Recent Labs  08/14/16 1103 08/15/16 0534  NA 142 142  K 4.2 4.0  CL 109 110  CO2 26 25  GLUCOSE 100* 91  BUN 22* 23*  CREATININE 1.36* 1.35*  CALCIUM 9.2 8.8*   PT/INR No results for input(s): LABPROT, INR in the last 72 hours. CMP     Component Value Date/Time   NA 142 08/15/2016 0534   K 4.0 08/15/2016 0534   CL 110 08/15/2016 0534   CO2 25 08/15/2016 0534   GLUCOSE 91  08/15/2016 0534   BUN 23 (H) 08/15/2016 0534   CREATININE 1.35 (H) 08/15/2016 0534   CREATININE 1.25 (H) 04/17/2016 1152   CALCIUM 8.8 (L) 08/15/2016 0534   PROT 6.7 08/14/2016 1103   ALBUMIN 4.0 08/14/2016 1103   AST 18 08/14/2016 1103   ALT 17 08/14/2016 1103   ALKPHOS 58 08/14/2016 1103   BILITOT 0.5 08/14/2016 1103   GFRNONAA 50 (L) 08/15/2016 0534   GFRNONAA 57 (L) 04/17/2016 1152   GFRAA 58 (L) 08/15/2016 0534   GFRAA 66 04/17/2016 1152   Lipase     Component Value Date/Time   LIPASE 28 08/14/2016 1103       Studies/Results: Abd 1 View (kub)  Result Date: 08/15/2016 CLINICAL DATA:  Recent bowel obstruction EXAM: ABDOMEN - 1 VIEW COMPARISON:  CT abdomen and pelvis August 14, 2016 ; abdomen series August 14, 2016 FINDINGS: Currently, the bowel gas pattern is unremarkable. No bowel obstruction or air-fluid levels. No free air evident. There is moderate stool throughout colon. There is contrast in urinary bladder. There are multiple phleboliths the pelvis. There is degenerative change in the lumbar spine. There is a probable bone island in the left femoral head. IMPRESSION: Bowel gas pattern unremarkable. No bowel obstruction or free air evident. Contrast noted in urinary bladder. Calcifications in the pelvis are consistent with phleboliths. Electronically Signed   By: Lowella Grip  III M.D.   On: 08/15/2016 09:05   Ct Abdomen Pelvis W Contrast  Result Date: 08/14/2016 CLINICAL DATA:  Right side abdominal pain for 5 days, diarrhea, possible small bowel obstruction EXAM: CT ABDOMEN AND PELVIS WITH CONTRAST TECHNIQUE: Multidetector CT imaging of the abdomen and pelvis was performed using the standard protocol following bolus administration of intravenous contrast. CONTRAST:  136mL ISOVUE-300 IOPAMIDOL (ISOVUE-300) INJECTION 61% COMPARISON:  Abdominal x-ray 08/14/2016 FINDINGS: Lower chest: The lungs are bases are unremarkable. Hepatobiliary: Mild fatty infiltration of the  liver. Few hepatic cysts are noted the largest in left hepatic lobe measures 1 cm. No calcified gallstones are noted within gallbladder. Pancreas: Enhanced pancreas is unremarkable. Spleen: Enhanced spleen is unremarkable. Adrenals/Urinary Tract: No adrenal gland mass. Enhanced kidneys are symmetrical in size. No hydronephrosis or hydroureter. There is indeterminate lesion mid pole anterior aspect of the right kidney measures about 8 mm. Further correlation with MRI is recommended to exclude a renal cell carcinoma. Delayed renal images shows bilateral renal symmetrical excretion. Bilateral visualized proximal ureter is unremarkable. The urinary bladder is unremarkable. Stomach/Bowel: The study is limited without oral contrast. No gastric outlet obstruction. The the there are dilated small bowel loops with some air-fluid levels in mid abdomen and upper pelvis. Axial image 37 there is transition point in caliber of small bowel. Findings highly suspicious for early small bowel obstruction. Less likely ileus or enteritis. Clinical correlation is necessary. Terminal small bowel is decompressed small caliber. Moderate stool noted in right colon and proximal transverse colon. There is no pericecal inflammation. The appendix is not identified. Colonic diverticula are noted descending colon and sigmoid colon. No evidence of distal colitis or diverticulitis. Vascular/Lymphatic: No aortic aneurysm. Atherosclerotic calcifications of abdominal aorta and iliac arteries. No retroperitoneal or mesenteric adenopathy. Reproductive: Prostate gland seminal vesicles are unremarkable. Other: No ascites or free abdominal air. Bilateral inguinal scrotal canal small hernia containing fat without evidence of acute complication. Musculoskeletal: No destructive bony lesions are noted. Sagittal images of the spine shows degenerative changes lumbar spine. Degenerative changes bilateral SI joints. IMPRESSION: 1. Mild distended small bowel loops  with some air-fluid levels in mid abdomen and upper pelvis. There is transition point in caliber of small bowel in axial image 37. Findings highly suspicious for partial small bowel obstruction. Less likely ileus or enteritis. Clinical correlation is necessary. 2. No ascites or free abdominal air. 3. Moderate stool noted in right colon and proximal transverse colon. No pericecal inflammation. Appendix is not identified. 4. Distal colonic diverticula.  No evidence of acute diverticulitis. 5. There is indeterminate lesion in midpole anterior aspect of the right kidney measures about 8 mm. Further correlation with MRI is recommended to exclude renal cell carcinoma. 6. Mild fatty infiltration of the liver. 7. Degenerative changes lumbar spine. Electronically Signed   By: Lahoma Crocker M.D.   On: 08/14/2016 12:38   Dg Abd Acute W/chest  Result Date: 08/14/2016 CLINICAL DATA:  Abdominal pain and bloating EXAM: DG ABDOMEN ACUTE W/ 1V CHEST COMPARISON:  02/07/2016 FINDINGS: Cardiac shadow is within normal limits. The lungs are well aerated bilaterally. No focal infiltrate or sizable effusion is seen. Scattered large and small bowel gas is noted. Fecal material is noted within the colon. Mild small bowel dilatation is seen. Changes may represent a partial small bowel obstruction. Correlation with physical exam is recommended. No free air is noted. IMPRESSION: Mildly dilated small bowel loops which may represent a small-bowel ileus or partial small bowel obstruction given the air and fecal  material within the colon. Correlation with the physical exam is recommended. These results will be called to the ordering clinician or representative by the Radiology Department at the imaging location. Electronically Signed   By: Inez Catalina M.D.   On: 08/14/2016 10:19    Anti-infectives: Anti-infectives    None       Assessment/Plan  HD #1 Admitted for PSBO. Currently on bowel rest with IVF, however, clinically patient is  improving. KUB this morning consistent with unremarkable bowel gas pattern. No bowel obstruction or free air evident. Contrast noted in urinary bladder. Calcifications in the pelvis are consistent with phleboliths. Will advance diet to clears and encourage ambulation. May continue to advance diet as tolerated with continued clinical improvement. May be ready for discharge home later today or tomorrow. DVT/PE prophylaxis: Lovenox, ambulation.  LOS: 1 day    LEE Dioselina Brumbaugh, [facility] Surgery 08/15/2016, 11:04 AM

## 2016-08-15 NOTE — Progress Notes (Signed)
[facility] PROGRESS NOTE  Larry Butler [id number] DOB: 08/26/1941 DOA: 08/14/2016   PCP: Odette Fraction, MD     Assessment/Plan: Principal Problem:   Partial small bowel obstruction Active Problems:   Dehydration, mild   HTN (hypertension)   Asthma   GERD (gastroesophageal reflux disease)   Renal lesion (RIGHT) 40mm   75 y.o. male with medical history significant for hypertension, asthma, and remote history of appendectomy. Patient reports 5 days of right upper abdominal pain with abdominal fullness bloating and excessive belching. CT scan performed in the ER without oral contrast did reveal partial small bowel obstruction as well as moderate right colonic stool burden as well as a transition zone. Surgery consulted  Assessment and plan Partial small bowel obstruction -Patient presents with 5 days of abdominal pain without flatus with history of similar symptoms in the past that have responded to conservative home medical management -Partial small bowel obstruction confirmed on plain films as well as CT of the abdomen -Attempt 2 was made by ER staff to place NG tube but unable to pass tube; abdomen soft and despite recurrent episodic belching patient has not had any emesis so we'll continue to monitor -If patient develops recurrent emesis will need to take to interventional radiology/OP to have NG tube placed by radiologist -IV fluids with potassium at 100 mL per hour -Repeat abdominal film in a.m.  Surgery following  Trial of Dulcolax suppository -PR Tylenol for mild pain in IV Toradol for moderate pain -IV Zofran for nausea Lactic acid 1.1. Patient now passing flatus, improving Start patient on clear liquids      Dehydration, mild -IV fluids as described above -Follow electrolytes    HTN (hypertension) -On Norvasc at home which is on hold secondary to NPO/need for bowel rest -Current blood pressure well controlled -Last echocardiogram was in  2013 with normal LVEF    Asthma -Currently asymptomatic -Continue preadmission MDI    GERD (gastroesophageal reflux disease) -IV Pepcid    Renal lesion (RIGHT) 51mm,   -Incidental finding on CT abdomen and pelvis -d/w pt and wife; will need eventual MRI to characterize       DVT prophylaxsis Lovenox  Code Status:  Full code    Family Communication: Discussed in detail with the patient, all imaging results, lab results explained to the patient   Disposition Plan: 3-4 days      Consultants:   General surgery  Procedures:  *None  Antibiotics: Anti-infectives    None         HPI/Subjective: Passing flatus, hungry  Objective: Vitals:   08/14/16 1500 08/14/16 1555 08/14/16 2100 08/15/16 0529  BP: 128/86 (!) 142/72 120/60 127/71  Pulse: 70 63 70 63  Resp: 19 18 18 18   Temp:  97.9 F (36.6 C) 97.7 F (36.5 C) 98.1 F (36.7 C)  TempSrc:   Oral Oral  SpO2: 96% 96% 98% 97%  Weight:  95.3 kg (210 lb)    Height:  5\' 7"  (1.702 m)      Intake/Output Summary (Last 24 hours) at 08/15/16 0825 Last data filed at 08/15/16 0500  Gross per 24 hour  Intake          1266.67 ml  Output              351 ml  Net           915.67 ml    Exam:  Examination:  General exam: Appears calm and comfortable  Respiratory system: Clear to auscultation. Respiratory effort normal. Cardiovascular system: S1 & S2 heard, RRR. No JVD, murmurs, rubs, gallops or clicks. No pedal edema. Gastrointestinal system: Abdomen is nondistended, soft and nontender. No organomegaly or masses felt. Normal bowel sounds heard. Central nervous system: Alert and oriented. No focal neurological deficits. Extremities: Symmetric 5 x 5 power. Skin: No rashes, lesions or ulcers Psychiatry: Judgement and insight appear normal. Mood & affect appropriate.     Data Reviewed: I have personally reviewed following labs and imaging studies  Micro Results No results found for this or any previous  visit (from the past 240 hour(s)).  Radiology Reports Ct Abdomen Pelvis W Contrast  Result Date: 08/14/2016 CLINICAL DATA:  Right side abdominal pain for 5 days, diarrhea, possible small bowel obstruction EXAM: CT ABDOMEN AND PELVIS WITH CONTRAST TECHNIQUE: Multidetector CT imaging of the abdomen and pelvis was performed using the standard protocol following bolus administration of intravenous contrast. CONTRAST:  159mL ISOVUE-300 IOPAMIDOL (ISOVUE-300) INJECTION 61% COMPARISON:  Abdominal x-ray 08/14/2016 FINDINGS: Lower chest: The lungs are bases are unremarkable. Hepatobiliary: Mild fatty infiltration of the liver. Few hepatic cysts are noted the largest in left hepatic lobe measures 1 cm. No calcified gallstones are noted within gallbladder. Pancreas: Enhanced pancreas is unremarkable. Spleen: Enhanced spleen is unremarkable. Adrenals/Urinary Tract: No adrenal gland mass. Enhanced kidneys are symmetrical in size. No hydronephrosis or hydroureter. There is indeterminate lesion mid pole anterior aspect of the right kidney measures about 8 mm. Further correlation with MRI is recommended to exclude a renal cell carcinoma. Delayed renal images shows bilateral renal symmetrical excretion. Bilateral visualized proximal ureter is unremarkable. The urinary bladder is unremarkable. Stomach/Bowel: The study is limited without oral contrast. No gastric outlet obstruction. The the there are dilated small bowel loops with some air-fluid levels in mid abdomen and upper pelvis. Axial image 37 there is transition point in caliber of small bowel. Findings highly suspicious for early small bowel obstruction. Less likely ileus or enteritis. Clinical correlation is necessary. Terminal small bowel is decompressed small caliber. Moderate stool noted in right colon and proximal transverse colon. There is no pericecal inflammation. The appendix is not identified. Colonic diverticula are noted descending colon and sigmoid colon. No  evidence of distal colitis or diverticulitis. Vascular/Lymphatic: No aortic aneurysm. Atherosclerotic calcifications of abdominal aorta and iliac arteries. No retroperitoneal or mesenteric adenopathy. Reproductive: Prostate gland seminal vesicles are unremarkable. Other: No ascites or free abdominal air. Bilateral inguinal scrotal canal small hernia containing fat without evidence of acute complication. Musculoskeletal: No destructive bony lesions are noted. Sagittal images of the spine shows degenerative changes lumbar spine. Degenerative changes bilateral SI joints. IMPRESSION: 1. Mild distended small bowel loops with some air-fluid levels in mid abdomen and upper pelvis. There is transition point in caliber of small bowel in axial image 37. Findings highly suspicious for partial small bowel obstruction. Less likely ileus or enteritis. Clinical correlation is necessary. 2. No ascites or free abdominal air. 3. Moderate stool noted in right colon and proximal transverse colon. No pericecal inflammation. Appendix is not identified. 4. Distal colonic diverticula.  No evidence of acute diverticulitis. 5. There is indeterminate lesion in midpole anterior aspect of the right kidney measures about 8 mm. Further correlation with MRI is recommended to exclude renal cell carcinoma. 6. Mild fatty infiltration of the liver. 7. Degenerative changes lumbar spine. Electronically Signed   By: Lahoma Crocker M.D.   On: 08/14/2016 12:38   Dg Abd Acute W/chest  Result Date: 08/14/2016  CLINICAL DATA:  Abdominal pain and bloating EXAM: DG ABDOMEN ACUTE W/ 1V CHEST COMPARISON:  02/07/2016 FINDINGS: Cardiac shadow is within normal limits. The lungs are well aerated bilaterally. No focal infiltrate or sizable effusion is seen. Scattered large and small bowel gas is noted. Fecal material is noted within the colon. Mild small bowel dilatation is seen. Changes may represent a partial small bowel obstruction. Correlation with physical exam  is recommended. No free air is noted. IMPRESSION: Mildly dilated small bowel loops which may represent a small-bowel ileus or partial small bowel obstruction given the air and fecal material within the colon. Correlation with the physical exam is recommended. These results will be called to the ordering clinician or representative by the Radiology Department at the imaging location. Electronically Signed   By: Inez Catalina M.D.   On: 08/14/2016 10:19     CBC  Recent Labs Lab 08/14/16 1103 08/15/16 0534  WBC 7.4 5.8  HGB 15.1 13.9  HCT 44.7 41.6  PLT 168 154  MCV 84.3 84.2  MCH 28.5 28.1  MCHC 33.8 33.4  RDW 13.9 13.9    Chemistries   Recent Labs Lab 08/14/16 1103 08/15/16 0534  NA 142 142  K 4.2 4.0  CL 109 110  CO2 26 25  GLUCOSE 100* 91  BUN 22* 23*  CREATININE 1.36* 1.35*  CALCIUM 9.2 8.8*  AST 18  --   ALT 17  --   ALKPHOS 58  --   BILITOT 0.5  --    ------------------------------------------------------------------------------------------------------------------ estimated creatinine clearance is 52.8 mL/min (by C-G formula based on SCr of 1.35 mg/dL (H)). ------------------------------------------------------------------------------------------------------------------ No results for input(s): HGBA1C in the last 72 hours. ------------------------------------------------------------------------------------------------------------------ No results for input(s): CHOL, HDL, LDLCALC, TRIG, CHOLHDL, LDLDIRECT in the last 72 hours. ------------------------------------------------------------------------------------------------------------------ No results for input(s): TSH, T4TOTAL, T3FREE, THYROIDAB in the last 72 hours.  Invalid input(s): FREET3 ------------------------------------------------------------------------------------------------------------------ No results for input(s): VITAMINB12, FOLATE, FERRITIN, TIBC, IRON, RETICCTPCT in the last 72  hours.  Coagulation profile No results for input(s): INR, PROTIME in the last 168 hours.  No results for input(s): DDIMER in the last 72 hours.  Cardiac Enzymes No results for input(s): CKMB, TROPONINI, MYOGLOBIN in the last 168 hours.  Invalid input(s): CK ------------------------------------------------------------------------------------------------------------------ Invalid input(s): POCBNP   CBG: No results for input(s): GLUCAP in the last 168 hours.     Studies: Ct Abdomen Pelvis W Contrast  Result Date: 08/14/2016 CLINICAL DATA:  Right side abdominal pain for 5 days, diarrhea, possible small bowel obstruction EXAM: CT ABDOMEN AND PELVIS WITH CONTRAST TECHNIQUE: Multidetector CT imaging of the abdomen and pelvis was performed using the standard protocol following bolus administration of intravenous contrast. CONTRAST:  126mL ISOVUE-300 IOPAMIDOL (ISOVUE-300) INJECTION 61% COMPARISON:  Abdominal x-ray 08/14/2016 FINDINGS: Lower chest: The lungs are bases are unremarkable. Hepatobiliary: Mild fatty infiltration of the liver. Few hepatic cysts are noted the largest in left hepatic lobe measures 1 cm. No calcified gallstones are noted within gallbladder. Pancreas: Enhanced pancreas is unremarkable. Spleen: Enhanced spleen is unremarkable. Adrenals/Urinary Tract: No adrenal gland mass. Enhanced kidneys are symmetrical in size. No hydronephrosis or hydroureter. There is indeterminate lesion mid pole anterior aspect of the right kidney measures about 8 mm. Further correlation with MRI is recommended to exclude a renal cell carcinoma. Delayed renal images shows bilateral renal symmetrical excretion. Bilateral visualized proximal ureter is unremarkable. The urinary bladder is unremarkable. Stomach/Bowel: The study is limited without oral contrast. No gastric outlet obstruction. The the there are dilated small bowel  loops with some air-fluid levels in mid abdomen and upper pelvis. Axial image 37  there is transition point in caliber of small bowel. Findings highly suspicious for early small bowel obstruction. Less likely ileus or enteritis. Clinical correlation is necessary. Terminal small bowel is decompressed small caliber. Moderate stool noted in right colon and proximal transverse colon. There is no pericecal inflammation. The appendix is not identified. Colonic diverticula are noted descending colon and sigmoid colon. No evidence of distal colitis or diverticulitis. Vascular/Lymphatic: No aortic aneurysm. Atherosclerotic calcifications of abdominal aorta and iliac arteries. No retroperitoneal or mesenteric adenopathy. Reproductive: Prostate gland seminal vesicles are unremarkable. Other: No ascites or free abdominal air. Bilateral inguinal scrotal canal small hernia containing fat without evidence of acute complication. Musculoskeletal: No destructive bony lesions are noted. Sagittal images of the spine shows degenerative changes lumbar spine. Degenerative changes bilateral SI joints. IMPRESSION: 1. Mild distended small bowel loops with some air-fluid levels in mid abdomen and upper pelvis. There is transition point in caliber of small bowel in axial image 37. Findings highly suspicious for partial small bowel obstruction. Less likely ileus or enteritis. Clinical correlation is necessary. 2. No ascites or free abdominal air. 3. Moderate stool noted in right colon and proximal transverse colon. No pericecal inflammation. Appendix is not identified. 4. Distal colonic diverticula.  No evidence of acute diverticulitis. 5. There is indeterminate lesion in midpole anterior aspect of the right kidney measures about 8 mm. Further correlation with MRI is recommended to exclude renal cell carcinoma. 6. Mild fatty infiltration of the liver. 7. Degenerative changes lumbar spine. Electronically Signed   By: Lahoma Crocker M.D.   On: 08/14/2016 12:38   Dg Abd Acute W/chest  Result Date: 08/14/2016 CLINICAL DATA:   Abdominal pain and bloating EXAM: DG ABDOMEN ACUTE W/ 1V CHEST COMPARISON:  02/07/2016 FINDINGS: Cardiac shadow is within normal limits. The lungs are well aerated bilaterally. No focal infiltrate or sizable effusion is seen. Scattered large and small bowel gas is noted. Fecal material is noted within the colon. Mild small bowel dilatation is seen. Changes may represent a partial small bowel obstruction. Correlation with physical exam is recommended. No free air is noted. IMPRESSION: Mildly dilated small bowel loops which may represent a small-bowel ileus or partial small bowel obstruction given the air and fecal material within the colon. Correlation with the physical exam is recommended. These results will be called to the ordering clinician or representative by the Radiology Department at the imaging location. Electronically Signed   By: Inez Catalina M.D.   On: 08/14/2016 10:19      No results found for: HGBA1C Lab Results  Component Value Date   LDLCALC 119 06/05/2015   CREATININE 1.35 (H) 08/15/2016       Scheduled Meds: . enoxaparin (LOVENOX) injection  40 mg Subcutaneous Q24H  . famotidine (PEPCID) IV  20 mg Intravenous Q12H   Continuous Infusions: . 0.9 % NaCl with KCl 20 mEq / L 100 mL/hr at 08/15/16 0500     LOS: 1 day    Time spent: >30 MINS    [facility] Pager [phone]. If 7PM-7AM, please contact night-coverage at [email or web address], password [facility] 08/15/2016, 8:25 AM  LOS: 1 day

## 2016-08-16 DIAGNOSIS — E860 Dehydration: Secondary | ICD-10-CM

## 2016-08-16 LAB — COMPREHENSIVE METABOLIC PANEL
ALK PHOS: 56 U/L (ref 38–126)
ALT: 15 U/L — AB (ref 17–63)
ANION GAP: 7 (ref 5–15)
AST: 18 U/L (ref 15–41)
Albumin: 3.6 g/dL (ref 3.5–5.0)
BILIRUBIN TOTAL: 0.9 mg/dL (ref 0.3–1.2)
BUN: 18 mg/dL (ref 6–20)
CALCIUM: 8.9 mg/dL (ref 8.9–10.3)
CO2: 25 mmol/L (ref 22–32)
CREATININE: 1.32 mg/dL — AB (ref 0.61–1.24)
Chloride: 109 mmol/L (ref 101–111)
GFR, EST AFRICAN AMERICAN: 60 mL/min — AB (ref >60)
GFR, EST NON AFRICAN AMERICAN: 51 mL/min — AB (ref >60)
Glucose, Bld: 85 mg/dL (ref 65–99)
Potassium: 4 mmol/L (ref 3.5–5.1)
Sodium: 141 mmol/L (ref 135–145)
TOTAL PROTEIN: 6.5 g/dL (ref 6.5–8.1)

## 2016-08-16 LAB — CBC
HCT: 42 % (ref 39.0–52.0)
HEMOGLOBIN: 14.1 g/dL (ref 13.0–17.0)
MCH: 28.2 pg (ref 26.0–34.0)
MCHC: 33.6 g/dL (ref 30.0–36.0)
MCV: 84 fL (ref 78.0–100.0)
Platelets: 153 10*3/uL (ref 150–400)
RBC: 5 MIL/uL (ref 4.22–5.81)
RDW: 13.7 % (ref 11.5–15.5)
WBC: 5.1 10*3/uL (ref 4.0–10.5)

## 2016-08-16 MED ORDER — FLEET ENEMA 7-19 GM/118ML RE ENEM
1.0000 | ENEMA | Freq: Once | RECTAL | Status: AC
  Administered 2016-08-16: 1 via RECTAL
  Filled 2016-08-16: qty 1

## 2016-08-16 NOTE — Progress Notes (Signed)
[facility] PROGRESS NOTE  Larry Butler [id number] DOB: [birth year] DOA: 08/14/2016   PCP: Odette Fraction, MD     Assessment/Plan: Principal Problem:   Partial small bowel obstruction Active Problems:   Dehydration, mild   HTN (hypertension)   Asthma   GERD (gastroesophageal reflux disease)   Renal lesion (RIGHT) 88mm   75 y.o. male with medical history significant for hypertension, asthma, and remote history of appendectomy. Patient reports 5 days of right upper abdominal pain with abdominal fullness bloating and excessive belching. CT scan performed in the ER without oral contrast did reveal partial small bowel obstruction as well as moderate right colonic stool burden as well as a transition zone. Surgery consulted  Assessment and plan Partial small bowel obstruction -Patient presents with 5 days of abdominal pain without flatus with history of similar symptoms in the past that have responded to conservative home medical management -Partial small bowel obstruction confirmed on plain films as well as CT of the abdomen -Attempt 2 was made by ER staff to place NG tube but unable to pass tube; abdomen soft and despite recurrent episodic belching patient has not had any emesis so we'll continue to monitor -If patient develops recurrent emesis will need to take to interventional radiology/OP to have NG tube placed by radiologist -IV fluids with potassium at 100 mL per hour, reduce   Surgery following  Trial of Dulcolax suppository-had 2 small BM yesterday -PR Tylenol for mild pain in IV Toradol for moderate pain -IV Zofran for nausea Lactic acid 1.1. Patient now passing flatus, improving Advance  clear liquids to full liquids for dinner       Dehydration, mild -IV fluids as described above -Follow electrolytes    HTN (hypertension) -On Norvasc at home which is on hold secondary to NPO/need for bowel rest -Current blood pressure well controlled -Last  echocardiogram was in 2013 with normal LVEF    Asthma -Currently asymptomatic -Continue preadmission MDI    GERD (gastroesophageal reflux disease) -IV Pepcid    Renal lesion (RIGHT) 22mm,   -Incidental finding on CT abdomen and pelvis -d/w pt and wife; will need eventual MRI to characterize       DVT prophylaxsis Lovenox  Code Status:  Full code    Family Communication: Discussed in detail with the patient, all imaging results, lab results explained to the patient   Disposition Plan: in am if better     Consultants:   General surgery  Procedures:  *None  Antibiotics: Anti-infectives    None         HPI/Subjective: Passing flatus, but bloated , no nausea   Objective: Vitals:   08/15/16 0529 08/15/16 1448 08/15/16 1930 08/16/16 0619  BP: 127/71 (!) 152/95 (!) 142/77 134/66  Pulse: 63 79 66 67  Resp: 18 18 16 18   Temp: 98.1 F (36.7 C) 98.2 F (36.8 C) 98.4 F (36.9 C) 97.7 F (36.5 C)  TempSrc: Oral Oral Oral Oral  SpO2: 97% 99% 97% 96%  Weight:      Height:        Intake/Output Summary (Last 24 hours) at 08/16/16 1117 Last data filed at 08/16/16 0902  Gross per 24 hour  Intake             3110 ml  Output             1453 ml  Net             1657 ml  Exam:  Examination:  General exam: Appears calm and comfortable  Respiratory system: Clear to auscultation. Respiratory effort normal. Cardiovascular system: S1 & S2 heard, RRR. No JVD, murmurs, rubs, gallops or clicks. No pedal edema. Gastrointestinal system: Abdomen is nondistended, soft and nontender. No organomegaly or masses felt. Normal bowel sounds heard. Central nervous system: Alert and oriented. No focal neurological deficits. Extremities: Symmetric 5 x 5 power. Skin: No rashes, lesions or ulcers Psychiatry: Judgement and insight appear normal. Mood & affect appropriate.     Data Reviewed: I have personally reviewed following labs and imaging studies  Micro Results No  results found for this or any previous visit (from the past 240 hour(s)).  Radiology Reports Abd 1 View (kub)  Result Date: 08/15/2016 CLINICAL DATA:  Recent bowel obstruction EXAM: ABDOMEN - 1 VIEW COMPARISON:  CT abdomen and pelvis August 14, 2016 ; abdomen series August 14, 2016 FINDINGS: Currently, the bowel gas pattern is unremarkable. No bowel obstruction or air-fluid levels. No free air evident. There is moderate stool throughout colon. There is contrast in urinary bladder. There are multiple phleboliths the pelvis. There is degenerative change in the lumbar spine. There is a probable bone island in the left femoral head. IMPRESSION: Bowel gas pattern unremarkable. No bowel obstruction or free air evident. Contrast noted in urinary bladder. Calcifications in the pelvis are consistent with phleboliths. Electronically Signed   By: Lowella Grip III M.D.   On: 08/15/2016 09:05   Ct Abdomen Pelvis W Contrast  Result Date: 08/14/2016 CLINICAL DATA:  Right side abdominal pain for 5 days, diarrhea, possible small bowel obstruction EXAM: CT ABDOMEN AND PELVIS WITH CONTRAST TECHNIQUE: Multidetector CT imaging of the abdomen and pelvis was performed using the standard protocol following bolus administration of intravenous contrast. CONTRAST:  155mL ISOVUE-300 IOPAMIDOL (ISOVUE-300) INJECTION 61% COMPARISON:  Abdominal x-ray 08/14/2016 FINDINGS: Lower chest: The lungs are bases are unremarkable. Hepatobiliary: Mild fatty infiltration of the liver. Few hepatic cysts are noted the largest in left hepatic lobe measures 1 cm. No calcified gallstones are noted within gallbladder. Pancreas: Enhanced pancreas is unremarkable. Spleen: Enhanced spleen is unremarkable. Adrenals/Urinary Tract: No adrenal gland mass. Enhanced kidneys are symmetrical in size. No hydronephrosis or hydroureter. There is indeterminate lesion mid pole anterior aspect of the right kidney measures about 8 mm. Further correlation with MRI  is recommended to exclude a renal cell carcinoma. Delayed renal images shows bilateral renal symmetrical excretion. Bilateral visualized proximal ureter is unremarkable. The urinary bladder is unremarkable. Stomach/Bowel: The study is limited without oral contrast. No gastric outlet obstruction. The the there are dilated small bowel loops with some air-fluid levels in mid abdomen and upper pelvis. Axial image 37 there is transition point in caliber of small bowel. Findings highly suspicious for early small bowel obstruction. Less likely ileus or enteritis. Clinical correlation is necessary. Terminal small bowel is decompressed small caliber. Moderate stool noted in right colon and proximal transverse colon. There is no pericecal inflammation. The appendix is not identified. Colonic diverticula are noted descending colon and sigmoid colon. No evidence of distal colitis or diverticulitis. Vascular/Lymphatic: No aortic aneurysm. Atherosclerotic calcifications of abdominal aorta and iliac arteries. No retroperitoneal or mesenteric adenopathy. Reproductive: Prostate gland seminal vesicles are unremarkable. Other: No ascites or free abdominal air. Bilateral inguinal scrotal canal small hernia containing fat without evidence of acute complication. Musculoskeletal: No destructive bony lesions are noted. Sagittal images of the spine shows degenerative changes lumbar spine. Degenerative changes bilateral SI joints. IMPRESSION: 1. Mild distended  small bowel loops with some air-fluid levels in mid abdomen and upper pelvis. There is transition point in caliber of small bowel in axial image 37. Findings highly suspicious for partial small bowel obstruction. Less likely ileus or enteritis. Clinical correlation is necessary. 2. No ascites or free abdominal air. 3. Moderate stool noted in right colon and proximal transverse colon. No pericecal inflammation. Appendix is not identified. 4. Distal colonic diverticula.  No evidence of  acute diverticulitis. 5. There is indeterminate lesion in midpole anterior aspect of the right kidney measures about 8 mm. Further correlation with MRI is recommended to exclude renal cell carcinoma. 6. Mild fatty infiltration of the liver. 7. Degenerative changes lumbar spine. Electronically Signed   By: Lahoma Crocker M.D.   On: 08/14/2016 12:38   Dg Abd Acute W/chest  Result Date: 08/14/2016 CLINICAL DATA:  Abdominal pain and bloating EXAM: DG ABDOMEN ACUTE W/ 1V CHEST COMPARISON:  02/07/2016 FINDINGS: Cardiac shadow is within normal limits. The lungs are well aerated bilaterally. No focal infiltrate or sizable effusion is seen. Scattered large and small bowel gas is noted. Fecal material is noted within the colon. Mild small bowel dilatation is seen. Changes may represent a partial small bowel obstruction. Correlation with physical exam is recommended. No free air is noted. IMPRESSION: Mildly dilated small bowel loops which may represent a small-bowel ileus or partial small bowel obstruction given the air and fecal material within the colon. Correlation with the physical exam is recommended. These results will be called to the ordering clinician or representative by the Radiology Department at the imaging location. Electronically Signed   By: Inez Catalina M.D.   On: 08/14/2016 10:19     CBC  Recent Labs Lab 08/14/16 1103 08/15/16 0534 08/16/16 0724  WBC 7.4 5.8 5.1  HGB 15.1 13.9 14.1  HCT 44.7 41.6 42.0  PLT 168 154 153  MCV 84.3 84.2 84.0  MCH 28.5 28.1 28.2  MCHC 33.8 33.4 33.6  RDW 13.9 13.9 13.7    Chemistries   Recent Labs Lab 08/14/16 1103 08/15/16 0534 08/16/16 0724  NA 142 142 141  K 4.2 4.0 4.0  CL 109 110 109  CO2 26 25 25   GLUCOSE 100* 91 85  BUN 22* 23* 18  CREATININE 1.36* 1.35* 1.32*  CALCIUM 9.2 8.8* 8.9  AST 18  --  18  ALT 17  --  15*  ALKPHOS 58  --  56  BILITOT 0.5  --  0.9    ------------------------------------------------------------------------------------------------------------------ estimated creatinine clearance is 54 mL/min (by C-G formula based on SCr of 1.32 mg/dL (H)). ------------------------------------------------------------------------------------------------------------------ No results for input(s): HGBA1C in the last 72 hours. ------------------------------------------------------------------------------------------------------------------ No results for input(s): CHOL, HDL, LDLCALC, TRIG, CHOLHDL, LDLDIRECT in the last 72 hours. ------------------------------------------------------------------------------------------------------------------ No results for input(s): TSH, T4TOTAL, T3FREE, THYROIDAB in the last 72 hours.  Invalid input(s): FREET3 ------------------------------------------------------------------------------------------------------------------ No results for input(s): VITAMINB12, FOLATE, FERRITIN, TIBC, IRON, RETICCTPCT in the last 72 hours.  Coagulation profile No results for input(s): INR, PROTIME in the last 168 hours.  No results for input(s): DDIMER in the last 72 hours.  Cardiac Enzymes No results for input(s): CKMB, TROPONINI, MYOGLOBIN in the last 168 hours.  Invalid input(s): CK ------------------------------------------------------------------------------------------------------------------ Invalid input(s): POCBNP   CBG: No results for input(s): GLUCAP in the last 168 hours.     Studies: Abd 1 View (kub)  Result Date: 08/15/2016 CLINICAL DATA:  Recent bowel obstruction EXAM: ABDOMEN - 1 VIEW COMPARISON:  CT abdomen and pelvis August 14, 2016 ;  abdomen series August 14, 2016 FINDINGS: Currently, the bowel gas pattern is unremarkable. No bowel obstruction or air-fluid levels. No free air evident. There is moderate stool throughout colon. There is contrast in urinary bladder. There are multiple  phleboliths the pelvis. There is degenerative change in the lumbar spine. There is a probable bone island in the left femoral head. IMPRESSION: Bowel gas pattern unremarkable. No bowel obstruction or free air evident. Contrast noted in urinary bladder. Calcifications in the pelvis are consistent with phleboliths. Electronically Signed   By: Lowella Grip III M.D.   On: 08/15/2016 09:05   Ct Abdomen Pelvis W Contrast  Result Date: 08/14/2016 CLINICAL DATA:  Right side abdominal pain for 5 days, diarrhea, possible small bowel obstruction EXAM: CT ABDOMEN AND PELVIS WITH CONTRAST TECHNIQUE: Multidetector CT imaging of the abdomen and pelvis was performed using the standard protocol following bolus administration of intravenous contrast. CONTRAST:  135mL ISOVUE-300 IOPAMIDOL (ISOVUE-300) INJECTION 61% COMPARISON:  Abdominal x-ray 08/14/2016 FINDINGS: Lower chest: The lungs are bases are unremarkable. Hepatobiliary: Mild fatty infiltration of the liver. Few hepatic cysts are noted the largest in left hepatic lobe measures 1 cm. No calcified gallstones are noted within gallbladder. Pancreas: Enhanced pancreas is unremarkable. Spleen: Enhanced spleen is unremarkable. Adrenals/Urinary Tract: No adrenal gland mass. Enhanced kidneys are symmetrical in size. No hydronephrosis or hydroureter. There is indeterminate lesion mid pole anterior aspect of the right kidney measures about 8 mm. Further correlation with MRI is recommended to exclude a renal cell carcinoma. Delayed renal images shows bilateral renal symmetrical excretion. Bilateral visualized proximal ureter is unremarkable. The urinary bladder is unremarkable. Stomach/Bowel: The study is limited without oral contrast. No gastric outlet obstruction. The the there are dilated small bowel loops with some air-fluid levels in mid abdomen and upper pelvis. Axial image 37 there is transition point in caliber of small bowel. Findings highly suspicious for early small  bowel obstruction. Less likely ileus or enteritis. Clinical correlation is necessary. Terminal small bowel is decompressed small caliber. Moderate stool noted in right colon and proximal transverse colon. There is no pericecal inflammation. The appendix is not identified. Colonic diverticula are noted descending colon and sigmoid colon. No evidence of distal colitis or diverticulitis. Vascular/Lymphatic: No aortic aneurysm. Atherosclerotic calcifications of abdominal aorta and iliac arteries. No retroperitoneal or mesenteric adenopathy. Reproductive: Prostate gland seminal vesicles are unremarkable. Other: No ascites or free abdominal air. Bilateral inguinal scrotal canal small hernia containing fat without evidence of acute complication. Musculoskeletal: No destructive bony lesions are noted. Sagittal images of the spine shows degenerative changes lumbar spine. Degenerative changes bilateral SI joints. IMPRESSION: 1. Mild distended small bowel loops with some air-fluid levels in mid abdomen and upper pelvis. There is transition point in caliber of small bowel in axial image 37. Findings highly suspicious for partial small bowel obstruction. Less likely ileus or enteritis. Clinical correlation is necessary. 2. No ascites or free abdominal air. 3. Moderate stool noted in right colon and proximal transverse colon. No pericecal inflammation. Appendix is not identified. 4. Distal colonic diverticula.  No evidence of acute diverticulitis. 5. There is indeterminate lesion in midpole anterior aspect of the right kidney measures about 8 mm. Further correlation with MRI is recommended to exclude renal cell carcinoma. 6. Mild fatty infiltration of the liver. 7. Degenerative changes lumbar spine. Electronically Signed   By: Lahoma Crocker M.D.   On: 08/14/2016 12:38      No results found for: HGBA1C Lab Results  Component Value Date  LDLCALC 119 06/05/2015   CREATININE 1.32 (H) 08/16/2016       Scheduled Meds: .  amLODipine  2.5 mg Oral Daily  . enoxaparin (LOVENOX) injection  40 mg Subcutaneous Q24H  . famotidine (PEPCID) IV  20 mg Intravenous Q12H  . sodium phosphate  1 enema Rectal Once   Continuous Infusions: . 0.9 % NaCl with KCl 20 mEq / L 100 mL/hr at 08/16/16 0736     LOS: 2 days    Time spent: >30 MINS    [facility] Pager [phone]. If 7PM-7AM, please contact night-coverage at [email or web address], password [facility] 08/16/2016, 11:17 AM  LOS: 2 days

## 2016-08-16 NOTE — Progress Notes (Signed)
Subjective: Mild pain rt side. No n/v. No flatus this am but had flatus yesterday and 2 bms. Had clears this am without postprandial cramps like yesterday evening. But did have some belching with it  Objective: Vital signs in last 24 hours: Temp:  [97.7 F (36.5 C)-98.4 F (36.9 C)] 97.7 F (36.5 C) (10/14 0619) Pulse Rate:  [66-79] 67 (10/14 0619) Resp:  [16-18] 18 (10/14 0619) BP: (134-152)/(66-95) 134/66 (10/14 0619) SpO2:  [96 %-99 %] 96 % (10/14 0619) Last BM Date: 08/14/16  Intake/Output from previous day: 10/13 0701 - 10/14 0700 In: 2800 [P.O.:600; I.V.:2100; IV Piggyback:100] Out: [id number] [Urine:1550; Stool:3] Intake/Output this shift: Total I/O In: 360 [P.O.:360] Out: 200 [Urine:200]  Alert, laying in chair Nontoxic cta Soft, obese, very very subtle rt side ttp; no guarding. +bs  Lab Results:   Recent Labs  08/15/16 0534 08/16/16 0724  WBC 5.8 5.1  HGB 13.9 14.1  HCT 41.6 42.0  PLT 154 153   BMET  Recent Labs  08/15/16 0534 08/16/16 0724  NA 142 141  K 4.0 4.0  CL 110 109  CO2 25 25  GLUCOSE 91 85  BUN 23* 18  CREATININE 1.35* 1.32*  CALCIUM 8.8* 8.9   PT/INR No results for input(s): LABPROT, INR in the last 72 hours. ABG No results for input(s): PHART, HCO3 in the last 72 hours.  Invalid input(s): PCO2, PO2  Studies/Results: Abd 1 View (kub)  Result Date: 08/15/2016 CLINICAL DATA:  Recent bowel obstruction EXAM: ABDOMEN - 1 VIEW COMPARISON:  CT abdomen and pelvis August 14, 2016 ; abdomen series August 14, 2016 FINDINGS: Currently, the bowel gas pattern is unremarkable. No bowel obstruction or air-fluid levels. No free air evident. There is moderate stool throughout colon. There is contrast in urinary bladder. There are multiple phleboliths the pelvis. There is degenerative change in the lumbar spine. There is a probable bone island in the left femoral head. IMPRESSION: Bowel gas pattern unremarkable. No bowel obstruction or free air  evident. Contrast noted in urinary bladder. Calcifications in the pelvis are consistent with phleboliths. Electronically Signed   By: Lowella Grip III M.D.   On: 08/15/2016 09:05   Ct Abdomen Pelvis W Contrast  Result Date: 08/14/2016 CLINICAL DATA:  Right side abdominal pain for 5 days, diarrhea, possible small bowel obstruction EXAM: CT ABDOMEN AND PELVIS WITH CONTRAST TECHNIQUE: Multidetector CT imaging of the abdomen and pelvis was performed using the standard protocol following bolus administration of intravenous contrast. CONTRAST:  176mL ISOVUE-300 IOPAMIDOL (ISOVUE-300) INJECTION 61% COMPARISON:  Abdominal x-ray 08/14/2016 FINDINGS: Lower chest: The lungs are bases are unremarkable. Hepatobiliary: Mild fatty infiltration of the liver. Few hepatic cysts are noted the largest in left hepatic lobe measures 1 cm. No calcified gallstones are noted within gallbladder. Pancreas: Enhanced pancreas is unremarkable. Spleen: Enhanced spleen is unremarkable. Adrenals/Urinary Tract: No adrenal gland mass. Enhanced kidneys are symmetrical in size. No hydronephrosis or hydroureter. There is indeterminate lesion mid pole anterior aspect of the right kidney measures about 8 mm. Further correlation with MRI is recommended to exclude a renal cell carcinoma. Delayed renal images shows bilateral renal symmetrical excretion. Bilateral visualized proximal ureter is unremarkable. The urinary bladder is unremarkable. Stomach/Bowel: The study is limited without oral contrast. No gastric outlet obstruction. The the there are dilated small bowel loops with some air-fluid levels in mid abdomen and upper pelvis. Axial image 37 there is transition point in caliber of small bowel. Findings highly suspicious for early small bowel obstruction. Less  likely ileus or enteritis. Clinical correlation is necessary. Terminal small bowel is decompressed small caliber. Moderate stool noted in right colon and proximal transverse colon. There  is no pericecal inflammation. The appendix is not identified. Colonic diverticula are noted descending colon and sigmoid colon. No evidence of distal colitis or diverticulitis. Vascular/Lymphatic: No aortic aneurysm. Atherosclerotic calcifications of abdominal aorta and iliac arteries. No retroperitoneal or mesenteric adenopathy. Reproductive: Prostate gland seminal vesicles are unremarkable. Other: No ascites or free abdominal air. Bilateral inguinal scrotal canal small hernia containing fat without evidence of acute complication. Musculoskeletal: No destructive bony lesions are noted. Sagittal images of the spine shows degenerative changes lumbar spine. Degenerative changes bilateral SI joints. IMPRESSION: 1. Mild distended small bowel loops with some air-fluid levels in mid abdomen and upper pelvis. There is transition point in caliber of small bowel in axial image 37. Findings highly suspicious for partial small bowel obstruction. Less likely ileus or enteritis. Clinical correlation is necessary. 2. No ascites or free abdominal air. 3. Moderate stool noted in right colon and proximal transverse colon. No pericecal inflammation. Appendix is not identified. 4. Distal colonic diverticula.  No evidence of acute diverticulitis. 5. There is indeterminate lesion in midpole anterior aspect of the right kidney measures about 8 mm. Further correlation with MRI is recommended to exclude renal cell carcinoma. 6. Mild fatty infiltration of the liver. 7. Degenerative changes lumbar spine. Electronically Signed   By: Lahoma Crocker M.D.   On: 08/14/2016 12:38    Anti-infectives: Anti-infectives    None      Assessment/Plan: psbo  Def not worse. Plain film last night looked normal. Still with belching and not much flatus would hold on clears for lunch. If does well with clears for lunch without n/v/pain, adv to fulls tonight. No fever, tachycardia, wbc.   Leighton Ruff. Redmond Pulling, MD, FACS General, Bariatric, & Minimally  Invasive Surgery [facility], [place]   LOS: 2 days    Gayland Curry 08/16/2016

## 2016-08-17 NOTE — Discharge Summary (Signed)
Physician Discharge Summary  Larry Butler MRN: [id number] DOB/AGE: 07-22-1941 75 y.o.  PCP: Odette Fraction, MD   Admit date: 08/14/2016 Discharge date: 08/17/2016  Discharge Diagnoses:    Principal Problem:   Partial small bowel obstruction Active Problems:   Dehydration, mild   HTN (hypertension)   Asthma   GERD (gastroesophageal reflux disease)   Renal lesion (RIGHT) 74m    Follow-up recommendations Follow-up with PCP in 3-5 days , including all  additional recommended appointments as below Follow-up CBC, CMP in 3-5 days Patient needs to follow-up with urology to further evaluate lesion in the right kidney      Current Discharge Medication List    CONTINUE these medications which have NOT CHANGED   Details  acetaminophen (TYLENOL) 500 MG tablet Take 500-1,000 mg by mouth every 6 (six) hours as needed (For pain.).    albuterol (PROVENTIL HFA;VENTOLIN HFA) 108 (90 BASE) MCG/ACT inhaler Inhale 2 puffs into the lungs every 6 (six) hours as needed for wheezing or shortness of breath. Qty: 1 Inhaler, Refills: 5    allopurinol (ZYLOPRIM) 300 MG tablet TAKE HALF A TABLET (150MG) BY MOUTH DAILY. Qty: 30 tablet, Refills: 5    amLODipine (NORVASC) 2.5 MG tablet Take 1 tablet (2.5 mg total) by mouth daily after supper. Qty: 90 tablet, Refills: 3    aspirin EC 81 MG tablet Take 81 mg by mouth daily.    bismuth subsalicylate (PEPTO BISMOL) 262 MG chewable tablet Chew 524 mg by mouth as needed. Per dentist recommendations- Last dose 03/29/16    esomeprazole (NEXIUM) 40 MG capsule TAKE 1 CAPSULE(40 MG) BY MOUTH DAILY AS NEEDED Qty: 30 capsule, Refills: 0    fluticasone (FLONASE) 50 MCG/ACT nasal spray Place 2 sprays into both nostrils daily as needed for allergies.     Polyvinyl Alcohol-Povidone (REFRESH OP) Place 2 drops into both eyes daily as needed (For dry eyes.).    Probiotic Product (PROBIOTIC ADVANCED PO) Take 1 capsule by mouth daily.      STOP taking  these medications     loperamide (IMODIUM) 2 MG capsule          Discharge Condition: Stable   Discharge Instructions Get Medicines reviewed and adjusted: Please take all your medications with you for your next visit with your Primary MD  Please request your Primary MD to go over all hospital tests and procedure/radiological results at the follow up, please ask your Primary MD to get all Hospital records sent to his/her office.  If you experience worsening of your admission symptoms, develop shortness of breath, life threatening emergency, suicidal or homicidal thoughts you must seek medical attention immediately by calling 911 or calling your MD immediately if symptoms less severe.  You must read complete instructions/literature along with all the possible adverse reactions/side effects for all the Medicines you take and that have been prescribed to you. Take any new Medicines after you have completely understood and accpet all the possible adverse reactions/side effects.   Do not drive when taking Pain medications.   Do not take more than prescribed Pain, Sleep and Anxiety Medications  Special Instructions: If you have smoked or chewed Tobacco in the last 2 yrs please stop smoking, stop any regular Alcohol and or any Recreational drug use.  Wear Seat belts while driving.  Please note  You were cared for by a hospitalist during your hospital stay. Once you are discharged, your primary care physician will handle any further medical issues. Please note that NO REFILLS  for any discharge medications will be authorized once you are discharged, as it is imperative that you return to your primary care physician (or establish a relationship with a primary care physician if you do not have one) for your aftercare needs so that they can reassess your need for medications and monitor your lab values.     No Known Allergies    Disposition: 01-Home or Self Care   Consults:  general  surgery     Significant Diagnostic Studies:  Abd 1 View (kub)  Result Date: 08/15/2016 CLINICAL DATA:  Recent bowel obstruction EXAM: ABDOMEN - 1 VIEW COMPARISON:  CT abdomen and pelvis August 14, 2016 ; abdomen series August 14, 2016 FINDINGS: Currently, the bowel gas pattern is unremarkable. No bowel obstruction or air-fluid levels. No free air evident. There is moderate stool throughout colon. There is contrast in urinary bladder. There are multiple phleboliths the pelvis. There is degenerative change in the lumbar spine. There is a probable bone island in the left femoral head. IMPRESSION: Bowel gas pattern unremarkable. No bowel obstruction or free air evident. Contrast noted in urinary bladder. Calcifications in the pelvis are consistent with phleboliths. Electronically Signed   By: Lowella Grip III M.D.   On: 08/15/2016 09:05   Ct Abdomen Pelvis W Contrast  Result Date: 08/14/2016 CLINICAL DATA:  Right side abdominal pain for 5 days, diarrhea, possible small bowel obstruction EXAM: CT ABDOMEN AND PELVIS WITH CONTRAST TECHNIQUE:   IMPRESSION: 1. Mild distended small bowel loops with some air-fluid levels in mid abdomen and upper pelvis. There is transition point in caliber of small bowel in axial image 37. Findings highly suspicious for partial small bowel obstruction. Less likely ileus or enteritis. Clinical correlation is necessary. 2. No ascites or free abdominal air. 3. Moderate stool noted in right colon and proximal transverse colon. No pericecal inflammation. Appendix is not identified. 4. Distal colonic diverticula.  No evidence of acute diverticulitis. 5. There is indeterminate lesion in midpole anterior aspect of the right kidney measures about 8 mm. Further correlation with MRI is recommended to exclude renal cell carcinoma. 6. Mild fatty infiltration of the liver. 7. Degenerative changes lumbar spine. Electronically Signed   By: Lahoma Crocker M.D.   On: 08/14/2016 12:38   Dg  Abd Acute W/chest  Result Date: 08/14/2016 CLINICAL DATA:   IMPRESSION: Mildly dilated small bowel loops which may represent a small-bowel ileus or partial small bowel obstruction given the air and fecal material within the colon. Correlation with the physical exam is recommended. These results will be called to the ordering clinician or representative by the Radiology Department at the imaging location. Electronically Signed   By: Inez Catalina M.D.   On: 08/14/2016 10:19       Filed Weights   08/14/16 1555  Weight: 95.3 kg (210 lb)     Microbiology: No results found for this or any previous visit (from the past 240 hour(s)).     Blood Culture No results found for: SDES, [place], CULT, REPTSTATUS    Labs: Results for orders placed or performed during the hospital encounter of 08/14/16 (from the past 48 hour(s))  CBC     Status: None   Collection Time: 08/16/16  7:24 AM  Result Value Ref Range   WBC 5.1 4.0 - 10.5 K/uL   RBC 5.00 4.22 - 5.81 MIL/uL   Hemoglobin 14.1 13.0 - 17.0 g/dL   HCT 42.0 39.0 - 52.0 %   MCV 84.0 78.0 - 100.0  fL   MCH 28.2 26.0 - 34.0 pg   MCHC 33.6 30.0 - 36.0 g/dL   RDW 13.7 11.5 - 15.5 %   Platelets 153 150 - 400 K/uL  Comprehensive metabolic panel     Status: Abnormal   Collection Time: 08/16/16  7:24 AM  Result Value Ref Range   Sodium 141 135 - 145 mmol/L   Potassium 4.0 3.5 - 5.1 mmol/L   Chloride 109 101 - 111 mmol/L   CO2 25 22 - 32 mmol/L   Glucose, Bld 85 65 - 99 mg/dL   BUN 18 6 - 20 mg/dL   Creatinine, Ser 1.32 (H) 0.61 - 1.24 mg/dL   Calcium 8.9 8.9 - 10.3 mg/dL   Total Protein 6.5 6.5 - 8.1 g/dL   Albumin 3.6 3.5 - 5.0 g/dL   AST 18 15 - 41 U/L   ALT 15 (L) 17 - 63 U/L   Alkaline Phosphatase 56 38 - 126 U/L   Total Bilirubin 0.9 0.3 - 1.2 mg/dL   GFR calc non Af Amer 51 (L) >60 mL/min   GFR calc Af Amer 60 (L) >60 mL/min    Comment: (NOTE) The eGFR has been calculated using the CKD EPI equation. This calculation has  not been validated in all clinical situations. eGFR's persistently <60 mL/min signify possible Chronic Kidney Disease.    Anion gap 7 5 - 15     Lipid Panel     Component Value Date/Time   CHOL 182 06/05/2015 0837   TRIG 82 06/05/2015 0837   HDL 47 06/05/2015 0837   CHOLHDL 3.9 06/05/2015 0837   VLDL 16 06/05/2015 0837   LDLCALC 119 06/05/2015 0837     No results found for: HGBA1C   Lab Results  Component Value Date   LDLCALC 119 06/05/2015   CREATININE 1.32 (H) 08/16/2016     HPI :  75 y.o.malewith medical history significant for hypertension, asthma, and remote history of appendectomy. Patient reports 5 days of right upper abdominal pain with abdominal fullness bloating and excessive belching. Patient apparently had diarrhea for 2 weeks and was taking Imodium prior to admission. CT scan performed in the ER without oral contrast did reveal partial small bowel obstruction as well as moderate right colonic stool burden as well as a transition zone. Surgery consulted  HOSPITAL COURSE:   Partial small bowel obstruction -Patient presented with 5 days of abdominal pain without flatus with history of similar symptoms in the past that have responded to conservative home medical management -Partial small bowel obstruction confirmed on plain films as well as CT of the abdomen -Attempt 2 was made by ER staff to place NG tube but unable to pass tube;abdomen soft and despite recurrent episodic belching patient has not had any emesis so we'll continue to monitor Patient hydrated with IV fluids, surgery consulted,  Trial of Dulcolax suppository-had 2 small BM yesterday Treated with Zofran and Tylenol, conservative management Responded well, advance diet from clear liquids to full liquids to soft diet prior to discharge    Dehydration, mild -IV fluids as described above -Follow electrolytes  HTN (hypertension) Continue Norvasc -Current blood pressure well controlled -Last  echocardiogram was in 2013 with normal LVEF  Asthma -Currently asymptomatic -Continue preadmission MDI  GERD (gastroesophageal reflux disease) -IV Pepcid  Renal lesion (RIGHT) 46m,   -Incidental finding on CT abdomen and pelvis -d/w pt and wife;will need eventual MRI to characterize, patient is followed by Dr. EJunious Silk  Discharge Exam:  Blood  pressure (!) 144/75, pulse 63, temperature 97.8 F (36.6 C), temperature source Oral, resp. rate 17, height _0  (1.702 m), weight 95.3 kg (210 lb), SpO2 96 %.  General exam: Appears calm and comfortable  Respiratory system: Clear to auscultation. Respiratory effort normal. Cardiovascular system: S1 & S2 heard, RRR. No JVD, murmurs, rubs, gallops or clicks. No pedal edema. Gastrointestinal system: Abdomen is nondistended, soft and nontender. No organomegaly or masses felt. Normal bowel sounds heard. Central nervous system: Alert and oriented. No focal neurological deficits. Extremities: Symmetric 5 x 5 power. Skin: No rashes, lesions or ulcers Psychiatry: Judgement and insight appear normal. Mood & affect appropriate.     Follow-up Information    PICKARD,WARREN TOM, MD. Schedule an appointment as soon as possible for a visit in 2 day(s).   Specialty:  Family Medicine Why:  Call PCP to make this appointment as soon as possible, you need to see   PCP in 5-7 days Contact information: [address] [place] [address] [phone]           Signed: Reyne Dumas 08/17/2016, 10:20 AM        Time spent >45 mins

## 2016-08-17 NOTE — Progress Notes (Signed)
  Subjective: Tol FLD yesterday  +BM  Objective: Vital signs in last 24 hours: Temp:  [97.8 F (36.6 C)-98.5 F (36.9 C)] 97.8 F (36.6 C) (10/15 0512) Pulse Rate:  [63-67] 63 (10/15 0512) Resp:  [17-18] 17 (10/15 0512) BP: (129-144)/(71-75) 144/75 (10/15 0512) SpO2:  [96 %-99 %] 96 % (10/15 0512) Last BM Date: 08/16/16  Intake/Output from previous day: 10/14 0701 - 10/15 0700 In: 1538.3 [P.O.:480; I.V.:1008.3; IV Piggyback:50] Out: 2000 [Urine:2000] Intake/Output this shift: No intake/output data recorded.  General appearance: alert and cooperative GI: soft, non-tender; bowel sounds normal; no masses,  no organomegaly  Lab Results:   Recent Labs  08/15/16 0534 08/16/16 0724  WBC 5.8 5.1  HGB 13.9 14.1  HCT 41.6 42.0  PLT 154 153   BMET  Recent Labs  08/15/16 0534 08/16/16 0724  NA 142 141  K 4.0 4.0  CL 110 109  CO2 25 25  GLUCOSE 91 85  BUN 23* 18  CREATININE 1.35* 1.32*  CALCIUM 8.8* 8.9   PT/INR No results for input(s): LABPROT, INR in the last 72 hours. ABG No results for input(s): PHART, HCO3 in the last 72 hours.  Invalid input(s): PCO2, PO2  Studies/Results: No results found.  Anti-infectives: Anti-infectives    None      Assessment/Plan: pSBO  Advance diet to Soft Home after lunch if tol f/u PRN  LOS: 3 days    Rosario Jacks., [facility] 08/17/2016

## 2016-08-17 NOTE — Progress Notes (Signed)
Patient discharged to home with instructions given 

## 2016-08-18 NOTE — Consult Note (Signed)
            [facility] CM Primary Care Navigator  08/18/2016  Larry Butler Apr 03, 1941 [id number]  Attempt to see patient today for possible discharge needs but he was already discharged yesterday 08/17/16.  Primary care provider's office contacted [name]) to notify of patient's discharge and need for post hospital follow-up and transition of care.        For additional questions please contact:  Edwena Felty A. Samika Vetsch, BSN, RN-BC [facility] PRIMARY CARE Navigator Cell: [phone]

## 2016-08-19 ENCOUNTER — Ambulatory Visit (INDEPENDENT_AMBULATORY_CARE_PROVIDER_SITE_OTHER): Payer: Medicare Other | Admitting: Family Medicine

## 2016-08-19 ENCOUNTER — Encounter: Payer: Self-pay | Admitting: Family Medicine

## 2016-08-19 VITALS — BP 140/82 | HR 72 | Temp 98.3°F | Resp 18 | Ht 67.0 in | Wt 206.0 lb

## 2016-08-19 DIAGNOSIS — N2889 Other specified disorders of kidney and ureter: Secondary | ICD-10-CM | POA: Diagnosis not present

## 2016-08-19 DIAGNOSIS — Z79899 Other long term (current) drug therapy: Secondary | ICD-10-CM | POA: Diagnosis not present

## 2016-08-19 DIAGNOSIS — K56600 Partial intestinal obstruction, unspecified as to cause: Secondary | ICD-10-CM | POA: Diagnosis not present

## 2016-08-19 LAB — CBC WITH DIFFERENTIAL/PLATELET
BASOS PCT: 0 %
Basophils Absolute: 0 cells/uL (ref 0–200)
EOS ABS: 201 {cells}/uL (ref 15–500)
Eosinophils Relative: 3 %
HEMATOCRIT: 46.9 % (ref 38.5–50.0)
HEMOGLOBIN: 16 g/dL (ref 13.0–17.0)
LYMPHS PCT: 20 %
Lymphs Abs: 1340 cells/uL (ref 850–3900)
MCH: 28.7 pg (ref 27.0–33.0)
MCHC: 34.1 g/dL (ref 32.0–36.0)
MCV: 84.1 fL (ref 80.0–100.0)
MONOS PCT: 9 %
MPV: 9.4 fL (ref 7.5–12.5)
Monocytes Absolute: 603 cells/uL (ref 200–950)
NEUTROS PCT: 68 %
Neutro Abs: 4556 cells/uL (ref 1500–7800)
Platelets: 185 10*3/uL (ref 140–400)
RBC: 5.58 MIL/uL (ref 4.20–5.80)
RDW: 14.4 % (ref 11.0–15.0)
WBC: 6.7 10*3/uL (ref 3.8–10.8)

## 2016-08-19 LAB — COMPLETE METABOLIC PANEL WITH GFR
ALBUMIN: 4.2 g/dL (ref 3.6–5.1)
ALK PHOS: 72 U/L (ref 40–115)
ALT: 25 U/L (ref 9–46)
AST: 24 U/L (ref 10–35)
BILIRUBIN TOTAL: 0.5 mg/dL (ref 0.2–1.2)
BUN: 23 mg/dL (ref 7–25)
CALCIUM: 9.3 mg/dL (ref 8.6–10.3)
CO2: 24 mmol/L (ref 20–31)
CREATININE: 1.38 mg/dL — AB (ref 0.70–1.18)
Chloride: 103 mmol/L (ref 98–110)
GFR, Est African American: 58 mL/min — ABNORMAL LOW (ref >=60)
GFR, Est Non African American: 50 mL/min — ABNORMAL LOW (ref >=60)
GLUCOSE: 126 mg/dL — AB (ref 70–99)
Potassium: 4.2 mmol/L (ref 3.5–5.3)
SODIUM: 141 mmol/L (ref 135–146)
TOTAL PROTEIN: 6.6 g/dL (ref 6.1–8.1)

## 2016-08-19 NOTE — Progress Notes (Signed)
Subjective:    Patient ID: Larry Butler, male    DOB: 08-25-1941, 75 y.o.   MRN: [id number]  HPI  08/14/16 Patient awoke this morning around 3 AM suddenly with chest pressure. He denies any shortness of breath but he did complain of gaseous distention of his abdomen. He was belching constantly. He felt nauseated like he needed to throw up. Here today on encounter he is belching constantly throughout the exam. His abdomen is distended. He has hyperactive bowel sounds. There is no rebound although he does have guarding. EKG shows normal sinus rhythm with no is evidence of ischemia or infarction.  At that time, my plan was: I believe the patient has a small bowel obstruction. EKG today is completely normal. He is constantly burping. Also the patient for an abdominal series to evaluate for small bowel obstruction versus partial bowel obstruction. If suspicions are confirmed, he will need NG tube decompression.  08/19/16 CT scan CT scan confirmed partial small bowel obstruction. Patient was admitted to the hospital. They were unable to successfully pass NG tube. He was monitored for several days and partial small bowel obstruction improved with bowel rest. There was a coincidental finding of an 50mm right renal mass on CT scan that was indeterminate. He is here today for follow-up. He is again having diarrhea since he has stopped his Imodium. However he is now eating a soft diet without difficulty. He denies any nausea or vomiting. He denies any burping. He denies any gaseous distention. He denies any blood in his stool Past Medical History:  Diagnosis Date  . Arthritis   . Asthma    rarely uses inhalers/ followed by PCP  . Cataract    REMOVED  . Chronic kidney disease   . COPD (chronic obstructive pulmonary disease) ([place])   . GERD (gastroesophageal reflux disease)   . Gout   . HOH (hard of hearing)   . Hypertension   . Partial small bowel obstruction 08/14/2016  . PONV (postoperative  nausea and vomiting)   . Renal lesion    86mm, on right, Incidental finding on CT abdomen and pelvis /notes 08/14/2016  . Shortness of breath dyspnea    04/01/16  states no changes- "with exertion"   Past Surgical History:  Procedure Laterality Date  . APPENDECTOMY    . BACK SURGERY     fusion 1980's  . EYE SURGERY Bilateral    bliateral IOL with lens implant  . KNEE ARTHROSCOPY Right 04/08/2016   Procedure: RIGHT ARTHROSCOPY KNEE WITH MENISCAL DEBRIDEMENT, AND CONDROPLASTY;  Surgeon: Gaynelle Arabian, MD;  Location: [facility] ORS;  Service: Orthopedics;  Laterality: Right;  . PROSTATE SURGERY    . TONSILECTOMY/ADENOIDECTOMY WITH MYRINGOTOMY     Current Outpatient Prescriptions on File Prior to Visit  Medication Sig Dispense Refill  . acetaminophen (TYLENOL) 500 MG tablet Take 500-1,000 mg by mouth every 6 (six) hours as needed (For pain.).    Marland Kitchen albuterol (PROVENTIL HFA;VENTOLIN HFA) 108 (90 BASE) MCG/ACT inhaler Inhale 2 puffs into the lungs every 6 (six) hours as needed for wheezing or shortness of breath. 1 Inhaler 5  . allopurinol (ZYLOPRIM) 300 MG tablet TAKE HALF A TABLET (150MG ) BY MOUTH DAILY. 30 tablet 5  . amLODipine (NORVASC) 2.5 MG tablet Take 1 tablet (2.5 mg total) by mouth daily after supper. 90 tablet 3  . aspirin EC 81 MG tablet Take 81 mg by mouth daily.    Marland Kitchen esomeprazole (NEXIUM) 40 MG capsule TAKE 1 CAPSULE(40 MG) BY  MOUTH DAILY AS NEEDED 30 capsule 0  . fluticasone (FLONASE) 50 MCG/ACT nasal spray Place 2 sprays into both nostrils daily as needed for allergies.     . Polyvinyl Alcohol-Povidone (REFRESH OP) Place 2 drops into both eyes daily as needed (For dry eyes.).    Marland Kitchen Probiotic Product (PROBIOTIC ADVANCED PO) Take 1 capsule by mouth daily.     No current facility-administered medications on file prior to visit.    No Known Allergies Social History   Social History  . Marital status: Married    Spouse name: N/A  . Number of children: N/A  . Years of education: N/A    Occupational History  . Not on file.   Social History Main Topics  . Smoking status: Former Smoker    Quit date: 09/03/1977  . Smokeless tobacco: Never Used  . Alcohol use No  . Drug use: No  . Sexual activity: Yes   Other Topics Concern  . Not on file   Social History Narrative   One kind of adopted daughter.        Review of Systems  All other systems reviewed and are negative.      Objective:   Physical Exam  Cardiovascular: Normal rate, regular rhythm and normal heart sounds.   No murmur heard. Pulmonary/Chest: Effort normal and breath sounds normal. No respiratory distress. He has no wheezes. He has no rales.  Abdominal: Soft. Bowel sounds are normal. He exhibits no distension and no mass. There is no tenderness. There is no rigidity, no rebound and no guarding.  Vitals reviewed.         Assessment & Plan:  Partial small bowel obstruction - Plan: CBC with Differential/Platelet, COMPLETE METABOLIC PANEL WITH GFR  Renal mass, right - Plan: CT Abdomen Pelvis W Contrast Clinically the small bowel obstruction has resolved. I have advised the patient to gradually advance his diet over the next few days as tolerated. We will ignore the diarrhea for the next 2 weeks. After 2 weeks and the patient has completely recovered, I would like to try to stop Nexium to see if this could be causing his chronic diarrhea. If not I would consider Viberzi.  However I do not want to do anything that may exacerbate his recent partial bowel obstruction hence the reason we will wait for the next 2 weeks. The patient is also interested in weaning off allopurinol. I suggested decreasing the dose to 75 mg a day and then monitoring for a while. Regarding the coincidental finding of a mass on his right kidney, we discussed an MRI versus repeat imaging in 3 months. The patient elects to repeat a CT scan in 3 months. Depending upon the progression at that time, we may continue to monitor versus a  referral for biopsy.

## 2016-11-19 ENCOUNTER — Other Ambulatory Visit: Payer: Medicare Other

## 2016-11-26 ENCOUNTER — Other Ambulatory Visit: Payer: Self-pay | Admitting: Family Medicine

## 2016-11-26 DIAGNOSIS — N2889 Other specified disorders of kidney and ureter: Secondary | ICD-10-CM

## 2016-11-28 ENCOUNTER — Ambulatory Visit
Admission: RE | Admit: 2016-11-28 | Discharge: 2016-11-28 | Disposition: A | Discharge status: Home / Self Care | Payer: Medicare Other | Source: Ambulatory Visit | Attending: Family Medicine | Admitting: Family Medicine

## 2016-11-28 DIAGNOSIS — N2889 Other specified disorders of kidney and ureter: Secondary | ICD-10-CM | POA: Diagnosis not present

## 2016-11-28 MED ORDER — IOPAMIDOL (ISOVUE-300) INJECTION 61%
125.0000 mL | Freq: Once | INTRAVENOUS | Status: AC | PRN
  Administered 2016-11-28: 125 mL via INTRAVENOUS

## 2016-12-01 ENCOUNTER — Other Ambulatory Visit: Payer: Self-pay | Admitting: Family Medicine

## 2016-12-01 DIAGNOSIS — N2889 Other specified disorders of kidney and ureter: Secondary | ICD-10-CM

## 2016-12-29 ENCOUNTER — Encounter (HOSPITAL_COMMUNITY): Payer: Self-pay | Admitting: Emergency Medicine

## 2016-12-29 ENCOUNTER — Emergency Department (HOSPITAL_COMMUNITY): Payer: Medicare Other

## 2016-12-29 ENCOUNTER — Inpatient Hospital Stay (HOSPITAL_COMMUNITY)
Admission: EM | Admit: 2016-12-29 | Discharge: 2016-12-31 | DRG: 390 | Disposition: A | Discharge status: Home / Self Care | Payer: Medicare Other | Attending: Family Medicine | Admitting: Family Medicine

## 2016-12-29 DIAGNOSIS — K219 Gastro-esophageal reflux disease without esophagitis: Secondary | ICD-10-CM | POA: Diagnosis not present

## 2016-12-29 DIAGNOSIS — Z825 Family history of asthma and other chronic lower respiratory diseases: Secondary | ICD-10-CM

## 2016-12-29 DIAGNOSIS — Z66 Do not resuscitate: Secondary | ICD-10-CM | POA: Diagnosis not present

## 2016-12-29 DIAGNOSIS — H9190 Unspecified hearing loss, unspecified ear: Secondary | ICD-10-CM | POA: Diagnosis not present

## 2016-12-29 DIAGNOSIS — R079 Chest pain, unspecified: Secondary | ICD-10-CM | POA: Diagnosis present

## 2016-12-29 DIAGNOSIS — Z8249 Family history of ischemic heart disease and other diseases of the circulatory system: Secondary | ICD-10-CM

## 2016-12-29 DIAGNOSIS — M109 Gout, unspecified: Secondary | ICD-10-CM | POA: Diagnosis present

## 2016-12-29 DIAGNOSIS — N183 Chronic kidney disease, stage 3 (moderate): Secondary | ICD-10-CM | POA: Diagnosis present

## 2016-12-29 DIAGNOSIS — I2510 Atherosclerotic heart disease of native coronary artery without angina pectoris: Secondary | ICD-10-CM | POA: Diagnosis present

## 2016-12-29 DIAGNOSIS — Z823 Family history of stroke: Secondary | ICD-10-CM

## 2016-12-29 DIAGNOSIS — K56609 Unspecified intestinal obstruction, unspecified as to partial versus complete obstruction: Secondary | ICD-10-CM | POA: Diagnosis present

## 2016-12-29 DIAGNOSIS — J449 Chronic obstructive pulmonary disease, unspecified: Secondary | ICD-10-CM | POA: Diagnosis not present

## 2016-12-29 DIAGNOSIS — K56600 Partial intestinal obstruction, unspecified as to cause: Principal | ICD-10-CM

## 2016-12-29 DIAGNOSIS — E785 Hyperlipidemia, unspecified: Secondary | ICD-10-CM | POA: Diagnosis not present

## 2016-12-29 DIAGNOSIS — R140 Abdominal distension (gaseous): Secondary | ICD-10-CM

## 2016-12-29 DIAGNOSIS — Z8042 Family history of malignant neoplasm of prostate: Secondary | ICD-10-CM

## 2016-12-29 DIAGNOSIS — Z800 Family history of malignant neoplasm of digestive organs: Secondary | ICD-10-CM | POA: Diagnosis not present

## 2016-12-29 DIAGNOSIS — R112 Nausea with vomiting, unspecified: Secondary | ICD-10-CM | POA: Diagnosis not present

## 2016-12-29 DIAGNOSIS — Z87891 Personal history of nicotine dependence: Secondary | ICD-10-CM | POA: Diagnosis not present

## 2016-12-29 DIAGNOSIS — I129 Hypertensive chronic kidney disease with stage 1 through stage 4 chronic kidney disease, or unspecified chronic kidney disease: Secondary | ICD-10-CM | POA: Diagnosis not present

## 2016-12-29 DIAGNOSIS — Z803 Family history of malignant neoplasm of breast: Secondary | ICD-10-CM | POA: Diagnosis not present

## 2016-12-29 DIAGNOSIS — Z7982 Long term (current) use of aspirin: Secondary | ICD-10-CM

## 2016-12-29 DIAGNOSIS — Z833 Family history of diabetes mellitus: Secondary | ICD-10-CM | POA: Diagnosis not present

## 2016-12-29 DIAGNOSIS — R0789 Other chest pain: Secondary | ICD-10-CM | POA: Diagnosis not present

## 2016-12-29 HISTORY — DX: Pneumonia, unspecified organism: J18.9

## 2016-12-29 LAB — HEPATIC FUNCTION PANEL
ALT: 22 U/L (ref 17–63)
AST: 21 U/L (ref 15–41)
Albumin: 4.2 g/dL (ref 3.5–5.0)
Alkaline Phosphatase: 48 U/L (ref 38–126)
BILIRUBIN DIRECT: 0.1 mg/dL (ref 0.1–0.5)
BILIRUBIN INDIRECT: 0.4 mg/dL (ref 0.3–0.9)
TOTAL PROTEIN: 6.9 g/dL (ref 6.5–8.1)
Total Bilirubin: 0.5 mg/dL (ref 0.3–1.2)

## 2016-12-29 LAB — LIPASE, BLOOD: LIPASE: 25 U/L (ref 11–51)

## 2016-12-29 LAB — CBC
HCT: 44.9 % (ref 39.0–52.0)
Hemoglobin: 15.5 g/dL (ref 13.0–17.0)
MCH: 28.6 pg (ref 26.0–34.0)
MCHC: 34.5 g/dL (ref 30.0–36.0)
MCV: 82.8 fL (ref 78.0–100.0)
Platelets: 175 10*3/uL (ref 150–400)
RBC: 5.42 MIL/uL (ref 4.22–5.81)
RDW: 13.6 % (ref 11.5–15.5)
WBC: 6.1 10*3/uL (ref 4.0–10.5)

## 2016-12-29 LAB — BASIC METABOLIC PANEL
ANION GAP: 11 (ref 5–15)
BUN: 25 mg/dL — AB (ref 6–20)
CALCIUM: 9.5 mg/dL (ref 8.9–10.3)
CO2: 23 mmol/L (ref 22–32)
Chloride: 108 mmol/L (ref 101–111)
Creatinine, Ser: 1.41 mg/dL — ABNORMAL HIGH (ref 0.61–1.24)
GFR calc Af Amer: 55 mL/min — ABNORMAL LOW (ref >60)
GFR, EST NON AFRICAN AMERICAN: 48 mL/min — AB (ref >60)
GLUCOSE: 99 mg/dL (ref 65–99)
Potassium: 4 mmol/L (ref 3.5–5.1)
SODIUM: 142 mmol/L (ref 135–145)

## 2016-12-29 LAB — TROPONIN I: Troponin I: <0.03 ng/mL (ref <0.03)

## 2016-12-29 LAB — I-STAT TROPONIN, ED: TROPONIN I, POC: 0 ng/mL (ref 0.00–0.08)

## 2016-12-29 MED ORDER — ONDANSETRON HCL 4 MG/2ML IJ SOLN
4.0000 mg | Freq: Once | INTRAMUSCULAR | Status: AC
  Administered 2016-12-29: 4 mg via INTRAVENOUS
  Filled 2016-12-29: qty 2

## 2016-12-29 MED ORDER — HEPARIN SODIUM (PORCINE) 5000 UNIT/ML IJ SOLN
5000.0000 [IU] | Freq: Three times a day (TID) | INTRAMUSCULAR | Status: DC
  Administered 2016-12-29 – 2016-12-31 (×5): 5000 [IU] via SUBCUTANEOUS
  Filled 2016-12-29 (×5): qty 1

## 2016-12-29 MED ORDER — SODIUM CHLORIDE 0.9 % IV SOLN
INTRAVENOUS | Status: DC
  Administered 2016-12-29 – 2016-12-31 (×3): via INTRAVENOUS

## 2016-12-29 MED ORDER — GI COCKTAIL ~~LOC~~
30.0000 mL | Freq: Once | ORAL | Status: AC
  Administered 2016-12-29: 30 mL via ORAL
  Filled 2016-12-29: qty 30

## 2016-12-29 MED ORDER — ACETAMINOPHEN 650 MG RE SUPP: 650.0000 mg | Freq: Once | RECTAL | Status: DC

## 2016-12-29 MED ORDER — GI COCKTAIL ~~LOC~~
30.0000 mL | Freq: Two times a day (BID) | ORAL | Status: DC | PRN
  Administered 2016-12-30: 30 mL via ORAL
  Filled 2016-12-29: qty 30

## 2016-12-29 MED ORDER — PANTOPRAZOLE SODIUM 40 MG IV SOLR
40.0000 mg | INTRAVENOUS | Status: DC
  Administered 2016-12-29 – 2016-12-30 (×2): 40 mg via INTRAVENOUS
  Filled 2016-12-29 (×2): qty 40

## 2016-12-29 MED ORDER — ONDANSETRON HCL 4 MG/2ML IJ SOLN: 4.0000 mg | Freq: Four times a day (QID) | INTRAMUSCULAR | Status: DC | PRN

## 2016-12-29 MED ORDER — ACETAMINOPHEN 325 MG PO TABS: 650.0000 mg | ORAL_TABLET | ORAL | Status: DC | PRN

## 2016-12-29 NOTE — H&P (Signed)
[facility] Admission History and Physical Service Pager: [phone]  Patient name: Larry Butler Medical record number: [id number] Date of birth: [birth year] Age: 76 y.o. Gender: male  Primary Care Provider: Odette Fraction, MD Consultants: None  Code Status: Partial -- may intubate no CPR  Chief Complaint: chest pain  Assessment and Plan: Larry Butler is a 76 y.o. male presenting with chest pain. PMH is significant HTN, CAD, GERD, difficulty hearing, SBO in October 2017.   Chest pain:  Reported 10 out of 10 chest pressure while getting out of shower and had no relief with rest. Denies any associated nausea vomiting or diarrhea at that time. Received aspirin and nitroglycerin en route with EMS and reported some relief. Unclear CAD history, patient reported he had 2 caths in the past. Only risk factors for CAD include obesity and hypertension. Reportedly quit drinking over 35 years ago. EKG, chest x-ray and troponin negative. Vital signs stable, no lower extremity swelling, erythema or tenderness with wells score of 0.  Notes that pain does feel similar to prior episode of SBO that he had in October. Significant amount of belching during my examination after changing patient's position from prone to upright. Passing flatus and had BM this morning. Chest pain nonreproducible, abdomen distended, tender and tympanic upon palpation. Atypical presentation, more likely to be GI related than cardiac. Abdominal x-ray showed diffuse gaseous distention of the small bowel up to 4.1 cm with nondilated gas filled colon. Findings suggestive of ileus or evolving small bowel obstruction per radiology.  - admit for observation with telemetry under attending Hensel to rule out ACS - vitals per floor protocol - AM EKG - AM abdominal x-ray if persistent symptoms - GI cocktail - trend troponins  - nitroglycerin 0.4mg  q16min PRN chest pain - zofran PRN nausea - tylenol PRN  pain - NPO if symptoms persist - cont ASA 81mg  - obtain lipid panel, TSH, A1c  History of SBO: Presentation in October similar, but had no flatus or BM x4 days.  - NPO for bowel rest - GI cocktail prn - IV protonix  Hypertension: Takes amlodipine 2.5mg  daily.  - continue home regimen  GERD: Takes nexium daily - switch to IV  CKD: Cr elevated to 1.41, baseline appears to be around 1.3. Does not take ACE/arb. - holding nephrotoxic medication - AM BMP - MIVFs while NPO  FEN/GI: NPO/MIVFs/Protonix Prophylaxis: Subcutaneous heparin  Disposition: Admit for observation with telemetry to rule out ACS  History of Present Illness:  Larry Butler is a 76 y.o. male presenting with complaints of chest pain chest pain at 7 a.m after getting out of shower and starting to dry his back. He laid down but pain did not improve. Describes pain as a pressure, like someone sitting on his chest and some accompanying left hand tingling that resolved quickly. Wife checked blood pressure and saw SBP of 180 then 160 when EMS arrived.  Reports that he is passing a lot of gas and belching as well. Now pain at a 6 or a 7 and was 10/10 this morning. N/V began during ED stay. Position doesn't seem to help belly or chest pain.  Denies any SOB.   Review Of Systems: Per HPI with the following additions:   Review of Systems  Constitutional: Positive for chills. Negative for fever and weight loss.  HENT: Positive for hearing loss. Negative for congestion.   Eyes: Negative for double vision.  Respiratory: Positive for shortness of breath.  Snores  Gastrointestinal: Positive for abdominal pain, diarrhea and nausea. Negative for blood in stool and vomiting.  Genitourinary: Positive for dysuria (from time to time). Negative for urgency.       Trouble completely voiding at times  Musculoskeletal: Negative for back pain and neck pain.  Skin: Negative for itching and rash.  Neurological: Negative for  dizziness, focal weakness and headaches.    Patient Active Problem List   Diagnosis Date Noted  . Partial small bowel obstruction 08/14/2016  . Dehydration, mild 08/14/2016  . HTN (hypertension) 08/14/2016  . Asthma 08/14/2016  . GERD (gastroesophageal reflux disease) 08/14/2016  . Renal lesion (RIGHT) 47mm 08/14/2016  . Acute medial meniscal tear 04/08/2016  . CAD in native artery 02/07/2016    Past Medical History: Past Medical History:  Diagnosis Date  . Arthritis   . Asthma    rarely uses inhalers/ followed by PCP  . Cataract    REMOVED  . Chronic kidney disease   . COPD (chronic obstructive pulmonary disease) ([place])   . GERD (gastroesophageal reflux disease)   . Gout   . HOH (hard of hearing)   . Hypertension   . Partial small bowel obstruction 08/14/2016  . PONV (postoperative nausea and vomiting)   . Renal lesion    20mm, on right, Incidental finding on CT abdomen and pelvis /notes 08/14/2016  . Shortness of breath dyspnea    04/01/16  states no changes- "with exertion"    Past Surgical History: Past Surgical History:  Procedure Laterality Date  . APPENDECTOMY    . BACK SURGERY     fusion 1980's  . EYE SURGERY Bilateral    bliateral IOL with lens implant  . KNEE ARTHROSCOPY Right 04/08/2016   Procedure: RIGHT ARTHROSCOPY KNEE WITH MENISCAL DEBRIDEMENT, AND CONDROPLASTY;  Surgeon: Gaynelle Arabian, MD;  Location: [facility];  Service: Orthopedics;  Laterality: Right;  . PROSTATE SURGERY    . TONSILECTOMY/ADENOIDECTOMY WITH MYRINGOTOMY      Social History: Social History  Substance Use Topics  . Smoking status: Former Smoker    Quit date: 09/03/1977  . Smokeless tobacco: Never Used  . Alcohol use No   Additional social history: Lives with wife. 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  Please also refer to relevant sections of EMR.  Family History: Family History  Problem Relation Age of Onset  . Cancer Mother     breast  . Stroke Mother   . Cancer Father 67     colon  . Diabetes Sister   . Heart disease Brother 88    CAD, Pacemaker  . Diabetes Brother   . Hearing loss Brother   . Diabetes Brother   . Diabetes Brother   . Diabetes Brother   . Heart disease Brother 77    AVR  . Hearing loss Brother   . Cancer Brother     prostate  . Heart disease Brother   . Hearing loss Brother   . COPD Brother     Allergies and Medications: No Known Allergies No current facility-administered medications on file prior to encounter.    Current Outpatient Prescriptions on File Prior to Encounter  Medication Sig Dispense Refill  . acetaminophen (TYLENOL) 500 MG tablet Take 500-1,000 mg by mouth every 6 (six) hours as needed (For pain.).    Marland Kitchen albuterol (PROVENTIL HFA;VENTOLIN HFA) 108 (90 BASE) MCG/ACT inhaler Inhale 2 puffs into the lungs every 6 (six) hours as needed for wheezing or shortness of breath. 1 Inhaler  5  . amLODipine (NORVASC) 2.5 MG tablet Take 1 tablet (2.5 mg total) by mouth daily after supper. 90 tablet 3  . aspirin EC 81 MG tablet Take 81 mg by mouth daily.    Marland Kitchen esomeprazole (NEXIUM) 40 MG capsule TAKE 1 CAPSULE(40 MG) BY MOUTH DAILY AS NEEDED 30 capsule 0  . fluticasone (FLONASE) 50 MCG/ACT nasal spray Place 2 sprays into both nostrils daily as needed for allergies.     . Polyvinyl Alcohol-Povidone (REFRESH OP) Place 2 drops into both eyes daily as needed (For dry eyes.).    Marland Kitchen allopurinol (ZYLOPRIM) 300 MG tablet TAKE HALF A TABLET (150MG ) BY MOUTH DAILY. (Patient not taking: Reported on 12/29/2016) 30 tablet 5    Objective: BP 127/74   Pulse (!) 58   Temp 98.2 F (36.8 C) (Oral)   Resp 13   Ht 5\' 7"  (1.702 m)   Wt 212 lb (96.2 kg)   SpO2 94%   BMI 33.20 kg/m  Exam: General: 76 year old male sitting up in bed appearing comfortable and in no acute distress Eyes: EOMI, PERRL, non-injected, anicteric ENTM: no nasal drainage, clear oropharynx, poor dentition and MMM Neck: supple, no LAD Cardiovascular: RRR, no MRG, 2+ palpable  pulses, no edema Respiratory: no increased WOB, CTABL, no wheezing or rhonchi Gastrointestinal: soft, mod distention with mild TTP, no palpable masses, +BS MSK: no gross deformities or edema, FROM Derm: warm and dry, no new rashes Neuro: CN 2-10 WNL, AAOx3, sensation intact throughout, 5/5 strength upper and lower extremities Psych: Normal mood and affect  Labs and Imaging: CBC BMET   Recent Labs Lab 12/29/16 1210  WBC 6.1  HGB 15.5  HCT 44.9  PLT 175    Recent Labs Lab 12/29/16 1210  NA 142  K 4.0  CL 108  CO2 23  BUN 25*  CREATININE 1.41*  GLUCOSE 99  CALCIUM 9.5     Dg Chest 2 View  Result Date: 12/29/2016 CLINICAL DATA:  Chest pain associated with nausea and vomiting for several days. Former smoker. History of COPD/asthma, hypertension, coronary artery disease, EXAM: CHEST  2 VIEW COMPARISON:  Chest x-ray of August 14, 2016 FINDINGS: The lungs are mildly hyperinflated. There is no focal infiltrate. There is no pleural effusion. There is mild stable biapical pleural thickening. The heart and pulmonary vascularity are normal. There is calcification in the wall of the aortic arch. The bony thorax exhibits no acute abnormality. IMPRESSION: Chronic bronchitic changes.  No alveolar pneumonia nor CHF. Thoracic aortic atherosclerosis. Electronically Signed   By: David  Martinique M.D.   On: 12/29/2016 13:40   Dg Abd 2 Views  Result Date: 12/29/2016 CLINICAL DATA:  Chest pain with nausea and vomiting for a few days. EXAM: ABDOMEN - 2 VIEW COMPARISON:  CT scan 11/28/2016.  X-rays from 08/14/2016. FINDINGS: Upright film shows no evidence for intraperitoneal free air. Supine film shows relatively diffuse gaseous small bowel distention up to 4.1 cm. Gas is visible diffusely in the colon is well without distention. IMPRESSION: 1. No evidence for intraperitoneal free air. 2. Diffuse gaseous distension of small bowel up to about 4.1 cm in diameter with nondilated gas-filled colon. Imaging  features may be related to ileus or evolving small bowel obstruction. Electronically Signed   By: Misty Stanley M.D.   On: 12/29/2016 13:37   Rosana Berger, M.D 12/29/2016, 3:02 PM PGY-1, [place] pager: [phone], text pages welcome  Upper Level Addendum:  I have seen and evaluated this patient  along with Dr. Rosalyn Gess and reviewed the above note, making necessary revisions in pink.  Rogue Bussing, MD PGY-2,  [facility] 12/29/2016 7:09 PM

## 2016-12-29 NOTE — ED Triage Notes (Signed)
Pt arrives from home via [facility] c/o CP, left sided, no radiation.  EMS reports giving 324 ASA and 3 NTG, pain improved from 10/10 to 5/10.  Pt reports nausea, denies vomiting, LOC, SOB, diaphoresis. Resp e/u.

## 2016-12-29 NOTE — ED Provider Notes (Signed)
Emergency Department Provider Note   I have reviewed the triage vital signs and the nursing notes.   HISTORY  Chief Complaint Chest Pain   HPI Larry Butler is a 76 y.o. male with PMH of COPD, HTN, and prior SBO presents to the ED with chest pain, abdominal distension, and frequent burping. The patient reports some mild chest pressure when getting out of the shower today. The pain gradually worsened throughout the AM along with abdominal distension. Reports worsening with deep breathing or palpation of the abdomen. He notes a history of SBO and states that this feels similar. He did receive Nitro and ASA en route with EMS and had decreased chest pain with nitro. Symptoms are severe and non-radiating.    Past Medical History:  Diagnosis Date  . Arthritis   . Asthma    rarely uses inhalers/ followed by PCP  . Cataract    REMOVED  . Chronic kidney disease   . COPD (chronic obstructive pulmonary disease) ([place])   . GERD (gastroesophageal reflux disease)   . Gout   . HOH (hard of hearing)   . Hypertension   . Partial small bowel obstruction 08/14/2016  . PONV (postoperative nausea and vomiting)   . Renal lesion    45mm, on right, Incidental finding on CT abdomen and pelvis /notes 08/14/2016  . Shortness of breath dyspnea    04/01/16  states no changes- "with exertion"    Patient Active Problem List   Diagnosis Date Noted  . Nonspecific chest pain 12/29/2016  . Abdominal distension   . Partial small bowel obstruction 08/14/2016  . Dehydration, mild 08/14/2016  . HTN (hypertension) 08/14/2016  . Asthma 08/14/2016  . GERD (gastroesophageal reflux disease) 08/14/2016  . Renal lesion (RIGHT) 4mm 08/14/2016  . Acute medial meniscal tear 04/08/2016  . 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 APPENDECTOMY    . BACK SURGERY     fusion 1980's  . EYE SURGERY Bilateral    bliateral IOL with lens implant  . KNEE ARTHROSCOPY  Right 04/08/2016   Procedure: RIGHT ARTHROSCOPY KNEE WITH MENISCAL DEBRIDEMENT, AND CONDROPLASTY;  Surgeon: Gaynelle Arabian, MD;  Location: [facility];  Service: Orthopedics;  Laterality: Right;  . PROSTATE SURGERY    . TONSILECTOMY/ADENOIDECTOMY WITH MYRINGOTOMY        Allergies Patient has no known allergies.  Family History  Problem Relation Age of Onset  . Cancer Mother     breast  . Stroke Mother   . Cancer Father 73    colon  . Diabetes Sister   . Heart disease Brother 52    CAD, Pacemaker  . Diabetes Brother   . Hearing loss Brother   . Diabetes Brother   . Diabetes Brother   . Diabetes Brother   . Heart disease Brother 59    AVR  . Hearing loss Brother   . Cancer Brother     prostate  . Heart disease Brother   . Hearing loss Brother   . COPD Brother     Social History Social History  Substance Use Topics  . Smoking status: Former Smoker    Quit date: 09/03/1977  . Smokeless tobacco: Never Used  . Alcohol use No    Review of Systems  Constitutional: No fever/chills Eyes: No visual changes. ENT: No sore throat. Cardiovascular: Positive chest pain. Respiratory: Denies shortness of breath. Gastrointestinal: Positive abdominal pain. Positive nausea, no vomiting.  No diarrhea.  No constipation. Positive frequent belching.  Genitourinary: Negative for dysuria. Musculoskeletal: Negative for back pain. Skin: Negative for rash. Neurological: Negative for headaches, focal weakness or numbness.  10-point ROS otherwise negative.  ____________________________________________   PHYSICAL EXAM:  VITAL SIGNS: ED Triage Vitals  Enc Vitals Group     BP 12/29/16 1210 126/71     Pulse Rate 12/29/16 1210 68     Resp 12/29/16 1210 12     Temp 12/29/16 1210 98.2 F (36.8 C)     Temp Source 12/29/16 1210 Oral     SpO2 12/29/16 1205 98 %     Weight 12/29/16 1211 212 lb (96.2 kg)     Height 12/29/16 1211 5\' 7"  (1.702 m)     Pain Score 12/29/16 1213 5    Constitutional: Alert and oriented. Well appearing and in no acute distress. Frequent, loud burping.  Eyes: Conjunctivae are normal.  Head: Atraumatic. Nose: No congestion/rhinnorhea. Mouth/Throat: Mucous membranes are moist.  Neck: No stridor.  Cardiovascular: Normal rate, regular rhythm. Good peripheral circulation. Grossly normal heart sounds.   Respiratory: Normal respiratory effort.  No retractions. Lungs CTAB. Gastrointestinal: Soft with epigastric tympany and mild/moderate distention.  Musculoskeletal: No lower extremity tenderness nor edema. No gross deformities of extremities. Neurologic:  Normal speech and language. No gross focal neurologic deficits are appreciated.  Skin:  Skin is warm, dry and intact. No rash noted. Psychiatric: Mood and affect are normal. Speech and behavior are normal.  ____________________________________________   LABS (all labs ordered are listed, but only abnormal results are displayed)  Labs Reviewed  BASIC METABOLIC PANEL - Abnormal; Notable for the following:       Result Value   BUN 25 (*)    Creatinine, Ser 1.41 (*)    GFR calc non Af Amer 48 (*)    GFR calc Af Amer 55 (*)    All other components within normal limits  CBC  HEPATIC FUNCTION PANEL  LIPASE, BLOOD  TROPONIN I  TROPONIN I  TROPONIN I  I-STAT TROPOININ, ED   ____________________________________________  EKG   EKG Interpretation  Date/Time:  Monday December 29 2016 12:10:37 EST Ventricular Rate:  67 PR Interval:    QRS Duration: 98 QT Interval:  409 QTC Calculation: 432 R Axis:   68 Text Interpretation:  Sinus rhythm Minimal ST depression, inferior leads No STEMI.  Confirmed by Elayne Gruver MD, Tatiyana Foucher [id number]) on 12/29/2016 12:14:50 PM       ____________________________________________  RADIOLOGY  Dg Chest 2 View  Result Date: 12/29/2016 CLINICAL DATA:  Chest pain associated with nausea and vomiting for several days. Former smoker. History of COPD/asthma,  hypertension, coronary artery disease, EXAM: CHEST  2 VIEW COMPARISON:  Chest x-ray of August 14, 2016 FINDINGS: The lungs are mildly hyperinflated. There is no focal infiltrate. There is no pleural effusion. There is mild stable biapical pleural thickening. The heart and pulmonary vascularity are normal. There is calcification in the wall of the aortic arch. The bony thorax exhibits no acute abnormality. IMPRESSION: Chronic bronchitic changes.  No alveolar pneumonia nor CHF. Thoracic aortic atherosclerosis. Electronically Signed   By: David  Martinique M.D.   On: 12/29/2016 13:40   Dg Abd 2 Views  Result Date: 12/29/2016 CLINICAL DATA:  Chest pain with nausea and vomiting for a few days. EXAM: ABDOMEN - 2 VIEW COMPARISON:  CT scan 11/28/2016.  X-rays from 08/14/2016. FINDINGS: Upright film shows no evidence for intraperitoneal free air. Supine film shows relatively diffuse gaseous small bowel distention up  to 4.1 cm. Gas is visible diffusely in the colon is well without distention. IMPRESSION: 1. No evidence for intraperitoneal free air. 2. Diffuse gaseous distension of small bowel up to about 4.1 cm in diameter with nondilated gas-filled colon. Imaging features may be related to ileus or evolving small bowel obstruction. Electronically Signed   By: Misty Stanley M.D.   On: 12/29/2016 13:37    ____________________________________________   PROCEDURES  Procedure(s) performed:   Procedures  None ____________________________________________   INITIAL IMPRESSION / ASSESSMENT AND PLAN / ED COURSE  Pertinent labs & imaging results that were available during my care of the patient were reviewed by me and considered in my medical decision making (see chart for details).  Patient presents to the ED with chest pain and epigastric discomfort with frequent belching. Suspect CP is primarily GI in etiology. Plain film with distension but air in the colon. Patient is passing gas. Will hold on CT abdomen at  this time. No indication for NG tube placement and patient seems to be slowly decompressing himself. Discussed admission with the [facility] Medicine service who will be down to see the patient and place admission orders.    ____________________________________________  FINAL CLINICAL IMPRESSION(S) / ED DIAGNOSES  Final diagnoses:  Abdominal distension  Nonspecific chest pain     MEDICATIONS GIVEN DURING THIS VISIT:  Medications  acetaminophen (TYLENOL) tablet 650 mg (not administered)  ondansetron (ZOFRAN) injection 4 mg (not administered)  heparin injection 5,000 Units (5,000 Units Subcutaneous Given 12/29/16 1807)  0.9 %  sodium chloride infusion ( Intravenous New Bag/Given 12/29/16 1807)  ondansetron (ZOFRAN) injection 4 mg (4 mg Intravenous Given 12/29/16 1451)  gi cocktail (Maalox,Lidocaine,Donnatal) (30 mLs Oral Given 12/29/16 1628)     NEW OUTPATIENT MEDICATIONS STARTED DURING THIS VISIT:  None   Note:  This document was prepared using Dragon voice recognition software and may include unintentional dictation errors.  Nanda Quinton, MD Emergency Medicine  Margette Fast, MD 12/29/16 [phone]

## 2016-12-30 DIAGNOSIS — Z803 Family history of malignant neoplasm of breast: Secondary | ICD-10-CM | POA: Diagnosis not present

## 2016-12-30 DIAGNOSIS — I2510 Atherosclerotic heart disease of native coronary artery without angina pectoris: Secondary | ICD-10-CM | POA: Diagnosis not present

## 2016-12-30 DIAGNOSIS — K219 Gastro-esophageal reflux disease without esophagitis: Secondary | ICD-10-CM

## 2016-12-30 DIAGNOSIS — Z66 Do not resuscitate: Secondary | ICD-10-CM | POA: Diagnosis present

## 2016-12-30 DIAGNOSIS — M109 Gout, unspecified: Secondary | ICD-10-CM | POA: Diagnosis present

## 2016-12-30 DIAGNOSIS — E785 Hyperlipidemia, unspecified: Secondary | ICD-10-CM | POA: Diagnosis present

## 2016-12-30 DIAGNOSIS — K56600 Partial intestinal obstruction, unspecified as to cause: Secondary | ICD-10-CM | POA: Diagnosis not present

## 2016-12-30 DIAGNOSIS — K56609 Unspecified intestinal obstruction, unspecified as to partial versus complete obstruction: Secondary | ICD-10-CM | POA: Diagnosis not present

## 2016-12-30 DIAGNOSIS — Z823 Family history of stroke: Secondary | ICD-10-CM | POA: Diagnosis not present

## 2016-12-30 DIAGNOSIS — Z833 Family history of diabetes mellitus: Secondary | ICD-10-CM | POA: Diagnosis not present

## 2016-12-30 DIAGNOSIS — R140 Abdominal distension (gaseous): Secondary | ICD-10-CM | POA: Diagnosis not present

## 2016-12-30 DIAGNOSIS — N183 Chronic kidney disease, stage 3 (moderate): Secondary | ICD-10-CM | POA: Diagnosis not present

## 2016-12-30 DIAGNOSIS — Z800 Family history of malignant neoplasm of digestive organs: Secondary | ICD-10-CM | POA: Diagnosis not present

## 2016-12-30 DIAGNOSIS — Z87891 Personal history of nicotine dependence: Secondary | ICD-10-CM | POA: Diagnosis not present

## 2016-12-30 DIAGNOSIS — Z825 Family history of asthma and other chronic lower respiratory diseases: Secondary | ICD-10-CM | POA: Diagnosis not present

## 2016-12-30 DIAGNOSIS — J449 Chronic obstructive pulmonary disease, unspecified: Secondary | ICD-10-CM | POA: Diagnosis not present

## 2016-12-30 DIAGNOSIS — R079 Chest pain, unspecified: Secondary | ICD-10-CM | POA: Diagnosis not present

## 2016-12-30 DIAGNOSIS — I129 Hypertensive chronic kidney disease with stage 1 through stage 4 chronic kidney disease, or unspecified chronic kidney disease: Secondary | ICD-10-CM | POA: Diagnosis present

## 2016-12-30 DIAGNOSIS — Z8042 Family history of malignant neoplasm of prostate: Secondary | ICD-10-CM | POA: Diagnosis not present

## 2016-12-30 DIAGNOSIS — H9190 Unspecified hearing loss, unspecified ear: Secondary | ICD-10-CM | POA: Diagnosis present

## 2016-12-30 DIAGNOSIS — Z8249 Family history of ischemic heart disease and other diseases of the circulatory system: Secondary | ICD-10-CM | POA: Diagnosis not present

## 2016-12-30 DIAGNOSIS — Z7982 Long term (current) use of aspirin: Secondary | ICD-10-CM | POA: Diagnosis not present

## 2016-12-30 LAB — LIPID PANEL
CHOL/HDL RATIO: 6.2 ratio
Cholesterol: 197 mg/dL (ref 0–200)
HDL: 32 mg/dL — AB (ref >40)
LDL CALC: 143 mg/dL — AB (ref 0–99)
Triglycerides: 111 mg/dL (ref <150)
VLDL: 22 mg/dL (ref 0–40)

## 2016-12-30 LAB — BASIC METABOLIC PANEL WITH GFR
Anion gap: 7 (ref 5–15)
BUN: 27 mg/dL — ABNORMAL HIGH (ref 6–20)
CO2: 26 mmol/L (ref 22–32)
Calcium: 8.8 mg/dL — ABNORMAL LOW (ref 8.9–10.3)
Chloride: 109 mmol/L (ref 101–111)
Creatinine, Ser: 1.52 mg/dL — ABNORMAL HIGH (ref 0.61–1.24)
GFR calc Af Amer: 50 mL/min — ABNORMAL LOW (ref >60)
GFR calc non Af Amer: 43 mL/min — ABNORMAL LOW (ref >60)
Glucose, Bld: 104 mg/dL — ABNORMAL HIGH (ref 65–99)
Potassium: 4 mmol/L (ref 3.5–5.1)
Sodium: 142 mmol/L (ref 135–145)

## 2016-12-30 LAB — TSH: TSH: 0.901 u[IU]/mL (ref 0.350–4.500)

## 2016-12-30 MED ORDER — ATORVASTATIN CALCIUM 40 MG PO TABS
40.0000 mg | ORAL_TABLET | Freq: Every day | ORAL | Status: DC
  Administered 2016-12-30: 40 mg via ORAL
  Filled 2016-12-30: qty 1

## 2016-12-30 MED ORDER — SIMETHICONE 80 MG PO CHEW
80.0000 mg | CHEWABLE_TABLET | Freq: Once | ORAL | Status: AC
  Administered 2016-12-30: 80 mg via ORAL
  Filled 2016-12-30: qty 1

## 2016-12-30 NOTE — Discharge Summary (Signed)
[facility] Discharge Summary  Patient name: Larry Butler Medical record number: [id number] Date of birth: 1941/02/20 Age: 76 y.o. Gender: male Date of Admission: 12/29/2016  Date of Discharge: 12/31/16 Admitting Physician: Zenia Resides, MD  Primary Care Provider: Odette Fraction, MD Consultants: Cardiology  Indication for Hospitalization: Chest pain  Discharge Diagnoses/Problem List:  Partial small bowel obstruction Hypertension GERD Chronic kidney disease stage III Hyperlipidemia  Disposition: Discharge home  Discharge Condition: Stable, improved  Discharge Exam:  See progress note by Dr. Lucila Maine from 12/31/16  Brief Hospital Course:  Patient presented to emergency department with concern for chest pressure, abdominal distention and frequent belching. Pain was reportedly worse with deep breathing or palpation of the abdomen. Patient did report relief with nitroglycerin and aspirin en route to the emergency department.  EKG within normal limits, chest x-ray normal and negative troponins. Abdominal x-ray showed ileus versus developing small bowel obstruction. Patient was made nothing by mouth, started on maintenance IV fluids and was given simethicone and GI cocktail.  Symptoms improved and his diet was advanced to liquid diet, which he tolerated well.  Discussed case with PA from [place] who suggested scheduling an outpatient appointment. Appointment scheduled for patient for early March. At the time of discharge patient was tolerating diet well, and symptoms had greatly improved and he was requesting to go home.  Issues for Follow Up:  1. SBO: advance diet as tolerable. F/u with [facility] on 01/08/17 at 9:30AM 2. HLD: ASCVD score >7.5%, started on 40mg  lipitor 3. CKD III: baseline cr 1.3, elevated to 1.4 during admission.  Not on ace/arb.   Significant Procedures: none  Significant Labs and Imaging:   Recent Labs Lab  12/29/16 1210  WBC 6.1  HGB 15.5  HCT 44.9  PLT 175    Recent Labs Lab 12/29/16 1210 12/30/16 1006  NA 142 142  K 4.0 4.0  CL 108 109  CO2 23 26  GLUCOSE 99 104*  BUN 25* 27*  CREATININE 1.41* 1.52*  CALCIUM 9.5 8.8*  ALKPHOS 48  --   AST 21  --   ALT 22  --   ALBUMIN 4.2  --    Dg Chest 2 View  Result Date: 12/29/2016 CLINICAL DATA:  Chest pain associated with nausea and vomiting for several days. Former smoker. History of COPD/asthma, hypertension, coronary artery disease, EXAM: CHEST  2 VIEW COMPARISON:  Chest x-ray of August 14, 2016 FINDINGS: The lungs are mildly hyperinflated. There is no focal infiltrate. There is no pleural effusion. There is mild stable biapical pleural thickening. The heart and pulmonary vascularity are normal. There is calcification in the wall of the aortic arch. The bony thorax exhibits no acute abnormality. IMPRESSION: Chronic bronchitic changes.  No alveolar pneumonia nor CHF. Thoracic aortic atherosclerosis. Electronically Signed   By: David  Martinique M.D.   On: 12/29/2016 13:40   Dg Abd 2 Views  Result Date: 12/29/2016 CLINICAL DATA:  Chest pain with nausea and vomiting for a few days. EXAM: ABDOMEN - 2 VIEW COMPARISON:  CT scan 11/28/2016.  X-rays from 08/14/2016. FINDINGS: Upright film shows no evidence for intraperitoneal free air. Supine film shows relatively diffuse gaseous small bowel distention up to 4.1 cm. Gas is visible diffusely in the colon is well without distention. IMPRESSION: 1. No evidence for intraperitoneal free air. 2. Diffuse gaseous distension of small bowel up to about 4.1 cm in diameter with nondilated gas-filled colon. Imaging features may be related to ileus  or evolving small bowel obstruction. Electronically Signed   By: Misty Stanley M.D.   On: 12/29/2016 13:37     Results/Tests Pending at Time of Discharge: none  Discharge Medications:  Allergies as of 12/31/2016   No Known Allergies     Medication List    STOP  taking these medications   allopurinol 300 MG tablet Commonly known as:  ZYLOPRIM     TAKE these medications   acetaminophen 500 MG tablet Commonly known as:  TYLENOL Take 500-1,000 mg by mouth every 6 (six) hours as needed (For pain.).   albuterol 108 (90 Base) MCG/ACT inhaler Commonly known as:  PROVENTIL HFA;VENTOLIN HFA Inhale 2 puffs into the lungs every 6 (six) hours as needed for wheezing or shortness of breath.   amLODipine 2.5 MG tablet Commonly known as:  NORVASC Take 1 tablet (2.5 mg total) by mouth daily after supper.   aspirin EC 81 MG tablet Take 81 mg by mouth daily.   atorvastatin 40 MG tablet Commonly known as:  LIPITOR TAKE 1 TABLET BY MOUTH DAILY AT 6 PM   esomeprazole 40 MG capsule Commonly known as:  NEXIUM TAKE 1 CAPSULE(40 MG) BY MOUTH DAILY AS NEEDED   fluticasone 50 MCG/ACT nasal spray Commonly known as:  FLONASE Place 2 sprays into both nostrils daily as needed for allergies.   ondansetron 8 MG tablet Commonly known as:  ZOFRAN Take 1 tablet (8 mg total) by mouth every 8 (eight) hours as needed for nausea or vomiting.   REFRESH OP Place 2 drops into both eyes daily as needed (For dry eyes.).       Discharge Instructions: Please refer to Patient Instructions section of EMR for full details.  Patient was counseled important signs and symptoms that should prompt return to medical care, changes in medications, dietary instructions, activity restrictions, and follow up appointments.   Follow-Up Appointments: Follow-up Information    PICKARD,WARREN TOM, MD. Schedule an appointment as soon as possible for a visit.   Specialty:  Family Medicine Contact information: [address] [place] [address] [phone]        [facility]. Go on 01/08/2017.   Specialty:  Gastroenterology Why:  at 9:30 am Contact information: [place] [phone] [place],  DO 12/31/2016, 12:34 PM PGY-1, [place]

## 2016-12-30 NOTE — Care Management Obs Status (Signed)
[facility] NOTIFICATION   Patient Details  Name: Larry Butler MRN: [id number] Date of Birth: Jul 14, 1941   Medicare Observation Status Notification Given:  Yes    Carles Collet, RN 12/30/2016, 9:55 AM

## 2016-12-30 NOTE — Progress Notes (Signed)
Pt c/o 5/10 epigastric/Chest pain. EKG (-) acute changes, VS Stable. Pt began belching loudly, given GI Cocktail per PRN orders. Pt reports pain subsided. Will continue to monitor. Jessie Foot, RN

## 2016-12-30 NOTE — Progress Notes (Signed)
Family Medicine Teaching Service Daily Progress Note Intern Pager: [phone]  Patient name: Larry Butler Medical record number: [id number] Date of birth: [birth year] Age: 76 y.o. Gender: male  Primary Care Provider: Odette Fraction, MD Consultants: None Code Status: DNR  Pt Overview and Major Events to Date:  1. Admitted to FMTS  Assessment and Plan: Larry Butler is a 76 y.o. male presenting with chest pain. PMH is significant HTN, CAD, GERD, difficulty hearing, SBO in October 2017.   Ileus vs: developing SBO:  NPO overnight.  Passing gas and BM this morning and no vomiting. Decompression could help with symptoms. No significant pain on exam or leukocytosis making ischemia unlikely.  - repeat imaging if significant pain develops - continue NPO status - GI cocktail PRN - zofran PRN nausea - tylenol PRN pain - NPO if symptoms persist - cont ASA 81mg  - obtain A1c - simethicone 80mg  once - GI follow up 01/08/17 9:30AM at [place]   History of SBO: Presentation in October similar, but had no flatus or BM x4 days.  - NPO for bowel rest - GI cocktail prn - IV protonix  Hypertension: Takes amlodipine 2.5mg  daily.  - continue home regimen  GERD: Takes nexium daily - switch to IV protonix  CKD: Cr elevated to 1.41, baseline appears to be around 1.3. Does not take ACE/arb. - holding nephrotoxic medication - AM BMP - MIVFs while NPO  HLD: ASCVD risk >7.5% - initiated 40mg  lipitor  FEN/GI: NPO/MIVFs/Protonix Prophylaxis: Subcutaneous heparin  Disposition: Admit for observation with telemetry to rule out ACS  Subjective:  Sitting up in bed appearing comfortable. Noted intense chest pressure early this morning around 1:30AM without SOB or vomiting.  Did have some diarrhea this morning.   Objective: Temp:  [97.6 F (36.4 C)-98.2 F (36.8 C)] 97.9 F (36.6 C) (02/27 0500) Pulse Rate:  [56-69] 69 (02/27 0500) Resp:  [11-18] 18 (02/27 0500) BP:  (114-145)/(61-92) 130/68 (02/27 0500) SpO2:  [94 %-100 %] 100 % (02/27 0500) Weight:  [212 lb (96.2 kg)-212 lb 8 oz (96.4 kg)] 212 lb 8 oz (96.4 kg) (02/27 0500) Physical Exam: General: 76 yo M sitting up in bed appearing comfortable and in NAD Cardiovascular: RRR, no MRG Respiratory: normal WOB, CTABL, no wheezing or rhonchi Abdomen: soft, mildly distended, no TTP, hyperactive bowel sounds Extremities: warm and well perfused  Laboratory:  Recent Labs Lab 12/29/16 1210  WBC 6.1  HGB 15.5  HCT 44.9  PLT 175    Recent Labs Lab 12/29/16 1210  NA 142  K 4.0  CL 108  CO2 23  BUN 25*  CREATININE 1.41*  CALCIUM 9.5  PROT 6.9  BILITOT 0.5  ALKPHOS 48  ALT 22  AST 21  GLUCOSE 99    Imaging/Diagnostic Tests: Dg Chest 2 View  Result Date: 12/29/2016 CLINICAL DATA:  Chest pain associated with nausea and vomiting for several days. Former smoker. History of COPD/asthma, hypertension, coronary artery disease, EXAM: CHEST  2 VIEW COMPARISON:  Chest x-ray of August 14, 2016 FINDINGS: The lungs are mildly hyperinflated. There is no focal infiltrate. There is no pleural effusion. There is mild stable biapical pleural thickening. The heart and pulmonary vascularity are normal. There is calcification in the wall of the aortic arch. The bony thorax exhibits no acute abnormality. IMPRESSION: Chronic bronchitic changes.  No alveolar pneumonia nor CHF. Thoracic aortic atherosclerosis. Electronically Signed   By: David  Martinique M.D.   On: 12/29/2016 13:40   Dg Abd 2 Views  Result  Date: 12/29/2016 CLINICAL DATA:  Chest pain with nausea and vomiting for a few days. EXAM: ABDOMEN - 2 VIEW COMPARISON:  CT scan 11/28/2016.  X-rays from 08/14/2016. FINDINGS: Upright film shows no evidence for intraperitoneal free air. Supine film shows relatively diffuse gaseous small bowel distention up to 4.1 cm. Gas is visible diffusely in the colon is well without distention. IMPRESSION: 1. No evidence for  intraperitoneal free air. 2. Diffuse gaseous distension of small bowel up to about 4.1 cm in diameter with nondilated gas-filled colon. Imaging features may be related to ileus or evolving small bowel obstruction. Electronically Signed   By: Misty Stanley M.D.   On: 12/29/2016 13:37    Eloise Levels, MD 12/30/2016, 7:23 AM PGY-1, [place] Intern pager: [phone], text pages welcome

## 2016-12-31 ENCOUNTER — Other Ambulatory Visit: Payer: Self-pay | Admitting: Family Medicine

## 2016-12-31 LAB — HEMOGLOBIN A1C
HEMOGLOBIN A1C: 5.6 % (ref 4.8–5.6)
MEAN PLASMA GLUCOSE: 114 mg/dL

## 2016-12-31 MED ORDER — ATORVASTATIN CALCIUM 40 MG PO TABS: 40.0000 mg | ORAL_TABLET | Freq: Every day | ORAL | 0 refills | Status: DC

## 2016-12-31 MED ORDER — ONDANSETRON HCL 8 MG PO TABS: 8.0000 mg | ORAL_TABLET | Freq: Three times a day (TID) | ORAL | 0 refills | Status: DC | PRN

## 2016-12-31 NOTE — Discharge Instructions (Signed)
°  Advance diet as you are able to. We sent a prescription for nausea medicine, Zofran, to your pharmacy to take if you need to. Please follow up with your PCP within the next several days about your hospitalization. You have an appointment to see your GI doctor on 01/08/17.   Small Bowel Obstruction A small bowel obstruction means that something is blocking the small bowel. The small bowel is also called the small intestine. It is the long tube that connects the stomach to the colon. An obstruction will stop food and fluids from passing through the small bowel. Treatment depends on what is causing the problem and how bad the problem is. Follow these instructions at home:  Get a lot of rest.  Follow your diet as told by your doctor. You may need to:  Only drink clear liquids until you start to get better.  Avoid solid foods as told by your doctor.  Take over-the-counter and prescription medicines only as told by your doctor.  Keep all follow-up visits as told by your doctor. This is important. Contact a doctor if:  You have a fever.  You have chills. Get help right away if:  You have pain or cramps that get worse.  You throw up (vomit) blood.  You have a feeling of being sick to your stomach (nausea) that does not go away.  You cannot stop throwing up.  You cannot drink fluids.  You feel confused.  You feel dry or thirsty (dehydrated).  Your belly gets more bloated.  You feel weak or you pass out (faint). This information is not intended to replace advice given to you by your health care provider. Make sure you discuss any questions you have with your health care provider. Document Released: 11/27/2004 Document Revised: 06/16/2016 Document Reviewed: 12/14/2014 Elsevier Interactive Patient Education  2017 [employer].

## 2016-12-31 NOTE — Progress Notes (Signed)
Family Medicine Teaching Service Daily Progress Note Intern Pager: [phone]  Patient name: Larry Butler Medical record number: [id number] Date of birth: February 05, 1941 Age: 76 y.o. Gender: male  Primary Care Provider: Odette Fraction, MD Consultants: None Code Status: DNR  Pt Overview and Major Events to Date:  2/26 Admitted to FMTS for ACS r/o, concern for SBO  Assessment and Plan: Larry Butler is a 76 y.o. male presenting with chest pain. PMH is significant HTN, CAD, GERD, difficulty hearing, SBO in October 2017.   Ileus vs: developing SBO- improving:  Tolerating clear diet - GI cocktail PRN - zofran PRN nausea - tylenol PRN pain - cont ASA 81mg  - simethicone 80mg  once - GI follow up 01/08/17 9:30AM at [place]   History of SBO: Presentation in October similar, but had no flatus or BM x4 days.  - GI cocktail prn - IV protonix  Hypertension- stable: Takes amlodipine 2.5mg  daily.  - continue home regimen  GERD- stable: Takes nexium daily - switch to IV protonix while inpatient  CKD: Cr elevated to 1.41, baseline appears to be around 1.3. Does not take ACE/arb. This morning 1.52 - holding nephrotoxic medication - MIVFs  HLD: ASCVD risk >7.5% - initiated 40mg  lipitor  FEN/GI: clear diet, MIVFs/Protonix Prophylaxis: Subcutaneous heparin  Disposition: likely home today  Subjective:  Larry Butler is doing well. He would like to go home. He is having bowel movements, passing gas, pain is minimal. He denies chest pain, SOB  Objective: Temp:  [97.7 F (36.5 C)-98.7 F (37.1 C)] 98.2 F (36.8 C) (02/28 0345) Pulse Rate:  [65-74] 65 (02/28 0345) Resp:  [14-20] 14 (02/28 0345) BP: (125-148)/(56-75) 127/71 (02/28 0345) SpO2:  [96 %-100 %] 96 % (02/28 0345) Weight:  [211 lb 6.4 oz (95.9 kg)] 211 lb 6.4 oz (95.9 kg) (02/28 0345) Physical Exam: General: pleasant elderly gentleman sitting in chair in NAD Cardiovascular: RRR, no MRG Respiratory: normal WOB,  CTA bilaterally Abdomen: soft, non-tender, non-distended, +BS Extremities: warm and well perfused  Laboratory:  Recent Labs Lab 12/29/16 1210  WBC 6.1  HGB 15.5  HCT 44.9  PLT 175    Recent Labs Lab 12/29/16 1210 12/30/16 1006  NA 142 142  K 4.0 4.0  CL 108 109  CO2 23 26  BUN 25* 27*  CREATININE 1.41* 1.52*  CALCIUM 9.5 8.8*  PROT 6.9  --   BILITOT 0.5  --   ALKPHOS 48  --   ALT 22  --   AST 21  --   GLUCOSE 99 104*    Imaging/Diagnostic Tests: Dg Chest 2 View  Result Date: 12/29/2016 CLINICAL DATA:  Chest pain associated with nausea and vomiting for several days. Former smoker. History of COPD/asthma, hypertension, coronary artery disease, EXAM: CHEST  2 VIEW COMPARISON:  Chest x-ray of August 14, 2016 FINDINGS: The lungs are mildly hyperinflated. There is no focal infiltrate. There is no pleural effusion. There is mild stable biapical pleural thickening. The heart and pulmonary vascularity are normal. There is calcification in the wall of the aortic arch. The bony thorax exhibits no acute abnormality. IMPRESSION: Chronic bronchitic changes.  No alveolar pneumonia nor CHF. Thoracic aortic atherosclerosis. Electronically Signed   By: David  Martinique M.D.   On: 12/29/2016 13:40   Dg Abd 2 Views  Result Date: 12/29/2016 CLINICAL DATA:  Chest pain with nausea and vomiting for a few days. EXAM: ABDOMEN - 2 VIEW COMPARISON:  CT scan 11/28/2016.  X-rays from 08/14/2016. FINDINGS: Upright film shows no  evidence for intraperitoneal free air. Supine film shows relatively diffuse gaseous small bowel distention up to 4.1 cm. Gas is visible diffusely in the colon is well without distention. IMPRESSION: 1. No evidence for intraperitoneal free air. 2. Diffuse gaseous distension of small bowel up to about 4.1 cm in diameter with nondilated gas-filled colon. Imaging features may be related to ileus or evolving small bowel obstruction. Electronically Signed   By: Misty Stanley M.D.   On:  12/29/2016 13:37    Steve Rattler, DO 12/31/2016, 7:22 AM PGY-1, [place] Intern pager: [phone], text pages welcome

## 2016-12-31 NOTE — Telephone Encounter (Signed)
Will forward to Dr. Vanetta Shawl since patient is still in the hospital. [facility]

## 2016-12-31 NOTE — Plan of Care (Signed)
Problem: Safety: Goal: Ability to remain free from injury will improve Outcome: Progressing No falls or new signs of skin break down this shift. Fall risk bundle in place. Will continue to perform hourly rounding and maintain pt safety.

## 2017-01-08 ENCOUNTER — Encounter: Payer: Self-pay | Admitting: Gastroenterology

## 2017-01-08 ENCOUNTER — Ambulatory Visit (INDEPENDENT_AMBULATORY_CARE_PROVIDER_SITE_OTHER): Payer: Medicare Other | Admitting: Gastroenterology

## 2017-01-08 VITALS — BP 140/80 | HR 64 | Ht 67.0 in | Wt 205.8 lb

## 2017-01-08 DIAGNOSIS — Z8719 Personal history of other diseases of the digestive system: Secondary | ICD-10-CM

## 2017-01-08 NOTE — Patient Instructions (Signed)
You have been scheduled for a CT scan of the abdomen and pelvis at [facility] ([address]---this is in the same building as [facility]).   You are scheduled on 01-12-17 at 1:30 pm. You should arrive 15 minutes prior to your appointment time for registration. Please follow the written instructions below on the day of your exam:  WARNING: IF YOU ARE ALLERGIC TO IODINE/X-RAY DYE, PLEASE NOTIFY RADIOLOGY IMMEDIATELY AT [phone]! YOU WILL BE GIVEN A 13 HOUR PREMEDICATION PREP.  1) Do not eat or drink anything after 10:30 am (4 hours prior to your test)  You may take any medications as prescribed with a small amount of water except for the following: Metformin, Glucophage, Glucovance, Avandamet, Riomet, Fortamet, Actoplus Met, Janumet, Glumetza or Metaglip. The above medications must be held the day of the exam AND 48 hours after the exam.  The purpose of you drinking the oral contrast is to aid in the visualization of your intestinal tract. The contrast solution may cause some diarrhea. Before your exam is started, you will be given a small amount of fluid to drink. Depending on your individual set of symptoms, you may also receive an intravenous injection of x-ray contrast/dye. Plan on being at [facility] for 30 minutes or longer, depending on the type of exam you are having performed.  This test typically takes 30-45 minutes to complete.  If you have any questions regarding your exam or if you need to reschedule, you may call the CT department at [phone] between the hours of 8:00 am and 5:00 pm, Monday-Friday.  ________________________________________________________________________  We have given you a Low fiber diet handout.

## 2017-01-08 NOTE — Progress Notes (Signed)
01/08/2017 Larry Butler [id number] 10-Apr-1941   HISTORY OF PRESENT ILLNESS:  This is a 76 year old male who is known to Dr. Ardis Hughs.  Had colonoscopy in 07/2016 at which time he was found to have only diverticulosis.  Random biopsies throughout the colon showed benign mucosa without inflammation.  He presents to our office today for a hospital follow-up.  He was hospitalized in October 2017 with a partial SBO that resolved without surgical intervention.  This appeared to be in the mid to distal small bowel.  Was diagnosed by CT scan.  He was hospitalized again in February for suspected PSBO (only imaging was x-ray at that time).  This also resolved.  He tells me that he's had very similar episodes on and off for years.  Also has diarrhea intermittently as well and has complained of RLQ abdominal pains in the past.  Currently feels ok.  Was having some loose stools yesterday.  A lot of gas but no abdominal distention, pain, nausea, or vomiting.  Past Medical History:  Diagnosis Date  . Arthritis    "all over" (12/29/2016)  . Asthma    rarely uses inhalers/ followed by PCP  . Chronic kidney disease   . COPD (chronic obstructive pulmonary disease) ([place])   . GERD (gastroesophageal reflux disease)   . Gout   . HOH (hard of hearing)   . Hypertension   . Partial small bowel obstruction 08/14/2016  . Pneumonia 1962  . PONV (postoperative nausea and vomiting) 1985  . Renal lesion    32mm, on right, Incidental finding on CT abdomen and pelvis /notes 08/14/2016  . Shortness of breath dyspnea    04/01/16  states no changes- "with exertion"   Past Surgical History:  Procedure Laterality Date  . APPENDECTOMY    . CARDIAC CATHETERIZATION  2008; ~ 2013-2014  . CATARACT EXTRACTION W/ INTRAOCULAR LENS  IMPLANT, BILATERAL Bilateral   . KNEE ARTHROSCOPY Right 04/08/2016   Procedure: RIGHT ARTHROSCOPY KNEE WITH MENISCAL DEBRIDEMENT, AND CONDROPLASTY;  Surgeon: Gaynelle Arabian, MD;  Location: [facility];   Service: Orthopedics;  Laterality: Right;  . NASAL SEPTUM SURGERY    . POSTERIOR LUMBAR FUSION  1980s   "took bone from my right hip"  . PROSTATE SURGERY  ~ 2015   Archie Endo 11/30/2014  . TONSILECTOMY/ADENOIDECTOMY WITH MYRINGOTOMY      reports that he quit smoking about 39 years ago. His smoking use included Cigarettes. He has a 45.00 pack-year smoking history. He has never used smokeless tobacco. He reports that he does not drink alcohol or use drugs. family history includes Bone cancer in his brother; COPD in his brother; Cancer in his mother; Cancer (age of onset: 36) in his father; Colon cancer in his brother; Diabetes in his brother, brother, brother, brother, and sister; Hearing loss in his brother and brother; Heart disease in his brother and brother; Heart disease (age of onset: 52) in his brother; Heart disease (age of onset: 43) in his brother; Other in his brother; Prostate cancer in his brother; Stroke in his brother and mother. No Known Allergies    Outpatient Encounter Prescriptions as of 01/08/2017  Medication Sig  . acetaminophen (TYLENOL) 500 MG tablet Take 500-1,000 mg by mouth every 6 (six) hours as needed (For pain.).  Marland Kitchen albuterol (PROVENTIL HFA;VENTOLIN HFA) 108 (90 BASE) MCG/ACT inhaler Inhale 2 puffs into the lungs every 6 (six) hours as needed for wheezing or shortness of breath.  Marland Kitchen amLODipine (NORVASC) 2.5 MG tablet  Take 1 tablet (2.5 mg total) by mouth daily after supper.  Marland Kitchen aspirin EC 81 MG tablet Take 81 mg by mouth daily.  Marland Kitchen atorvastatin (LIPITOR) 40 MG tablet TAKE 1 TABLET BY MOUTH DAILY AT 6 PM  . esomeprazole (NEXIUM) 40 MG capsule TAKE 1 CAPSULE(40 MG) BY MOUTH DAILY AS NEEDED  . fluticasone (FLONASE) 50 MCG/ACT nasal spray Place 2 sprays into both nostrils daily as needed for allergies.   . Polyvinyl Alcohol-Povidone (REFRESH OP) Place 2 drops into both eyes daily as needed (For dry eyes.).  . [DISCONTINUED] ondansetron (ZOFRAN) 8 MG tablet Take 1 tablet (8 mg  total) by mouth every 8 (eight) hours as needed for nausea or vomiting.   No facility-administered encounter medications on file as of 01/08/2017.      REVIEW OF SYSTEMS  : All other systems reviewed and negative except where noted in the History of Present Illness.   PHYSICAL EXAM: BP 140/80   Pulse 64   Ht 5\' 7"  (1.702 m)   Wt 205 lb 12.8 oz (93.4 kg)   BMI 32.23 kg/m  General: Well developed white male in no acute distress Head: Normocephalic and atraumatic Eyes:  Sclerae anicteric, conjunctiva pink. Ears: Normal auditory acuity Lungs: Clear throughout to auscultation; no increased WOB. Heart: Regular rate and rhythm Abdomen: Soft, non-distended. Normal bowel sounds.  Non-tender. Musculoskeletal: Symmetrical with no gross deformities  Skin: No lesions on visible extremities Extremities: No edema  Neurological: Alert oriented x 4, grossly non-focal Psychological:  Alert and cooperative. 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  Only abdominal surgery was history of appendectomy.  Will check CT enterography to evaluate for any type of inflammation that would be indicative of small bowel Crohn's disease, lesion that could be a lead point for these obstructions.  Recommended low fiber/low residue diet for now.  CC:  Susy Frizzle, MD

## 2017-01-08 NOTE — Progress Notes (Signed)
I agree with the above note, plan 

## 2017-01-12 ENCOUNTER — Inpatient Hospital Stay: Admission: RE | Admit: 2017-01-12 | Payer: Medicare Other | Source: Ambulatory Visit

## 2017-01-13 ENCOUNTER — Inpatient Hospital Stay: Payer: Medicare Other | Admitting: Family Medicine

## 2017-01-17 IMAGING — CR DG CHEST 2V
2 series · 2 of 2 positions shown · non-contrast
Comparison: None.

CLINICAL DATA: Chronic shortness of breath for 1 year

EXAM:
CHEST  2 VIEW

[view not recorded (1 of 2)]
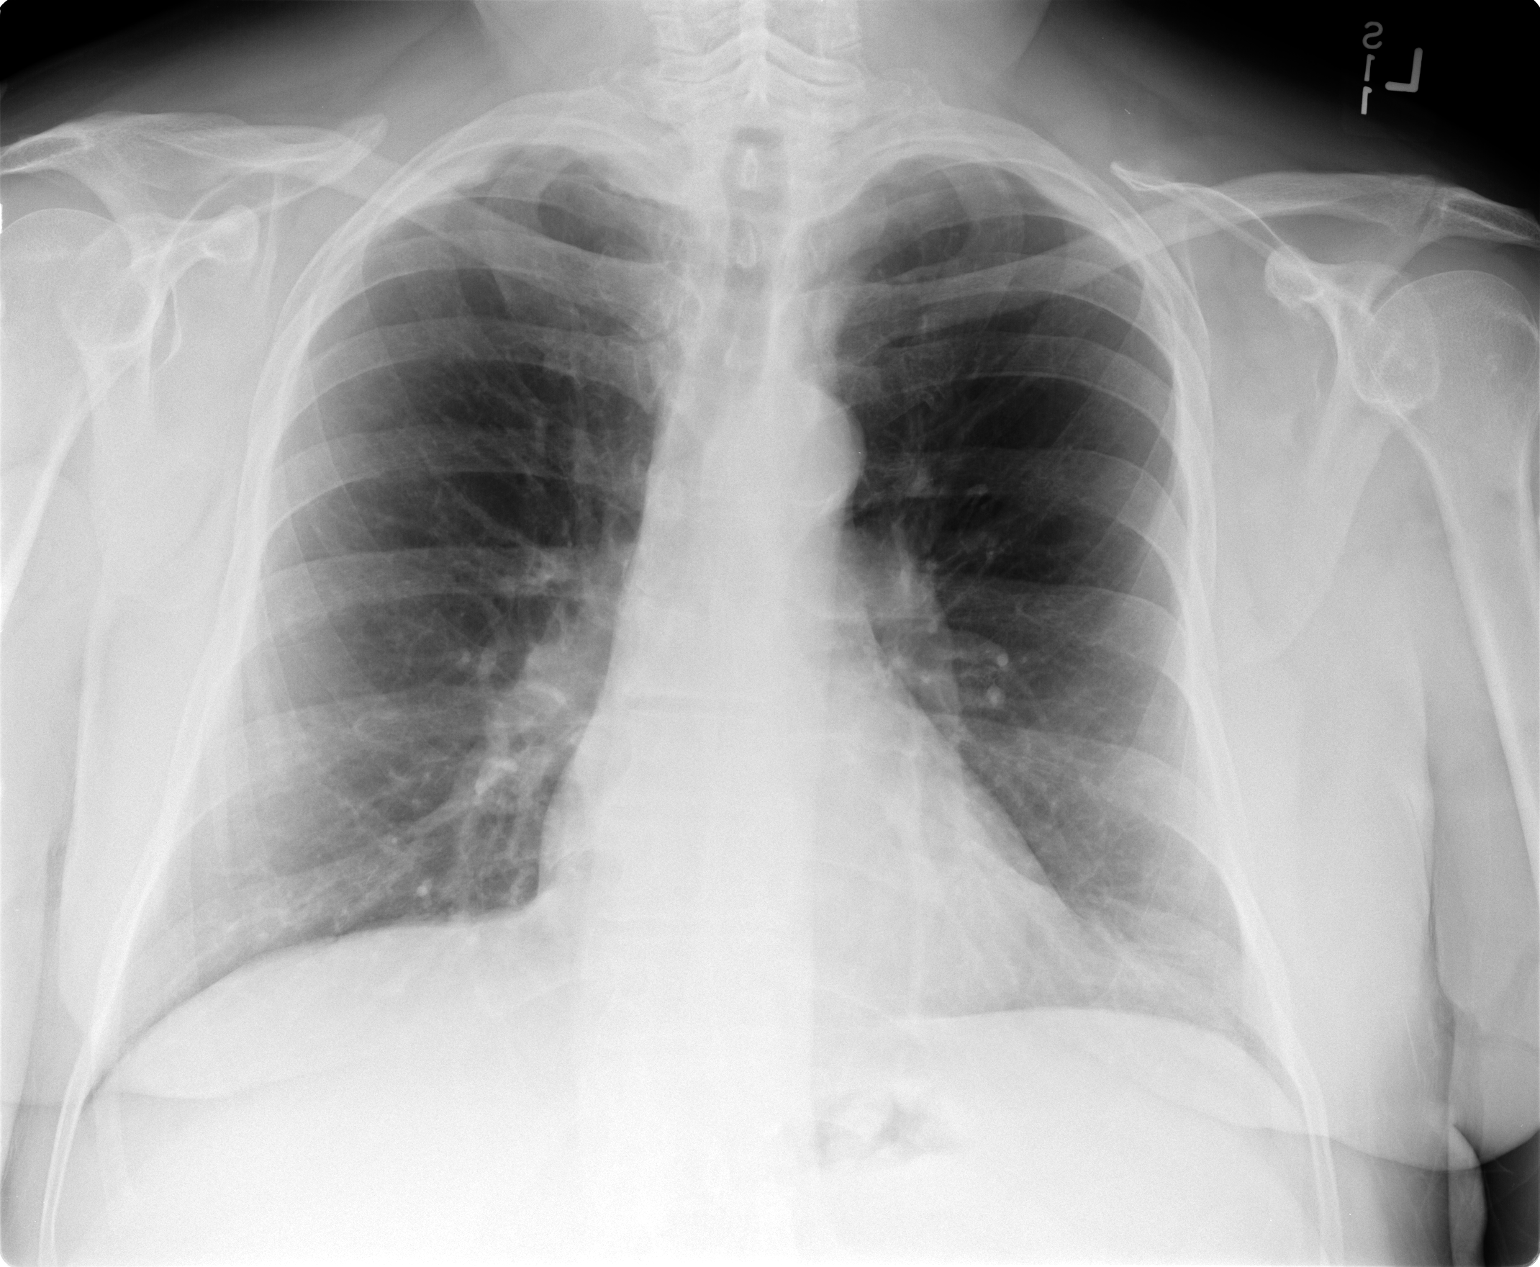

[view not recorded (2 of 2)]
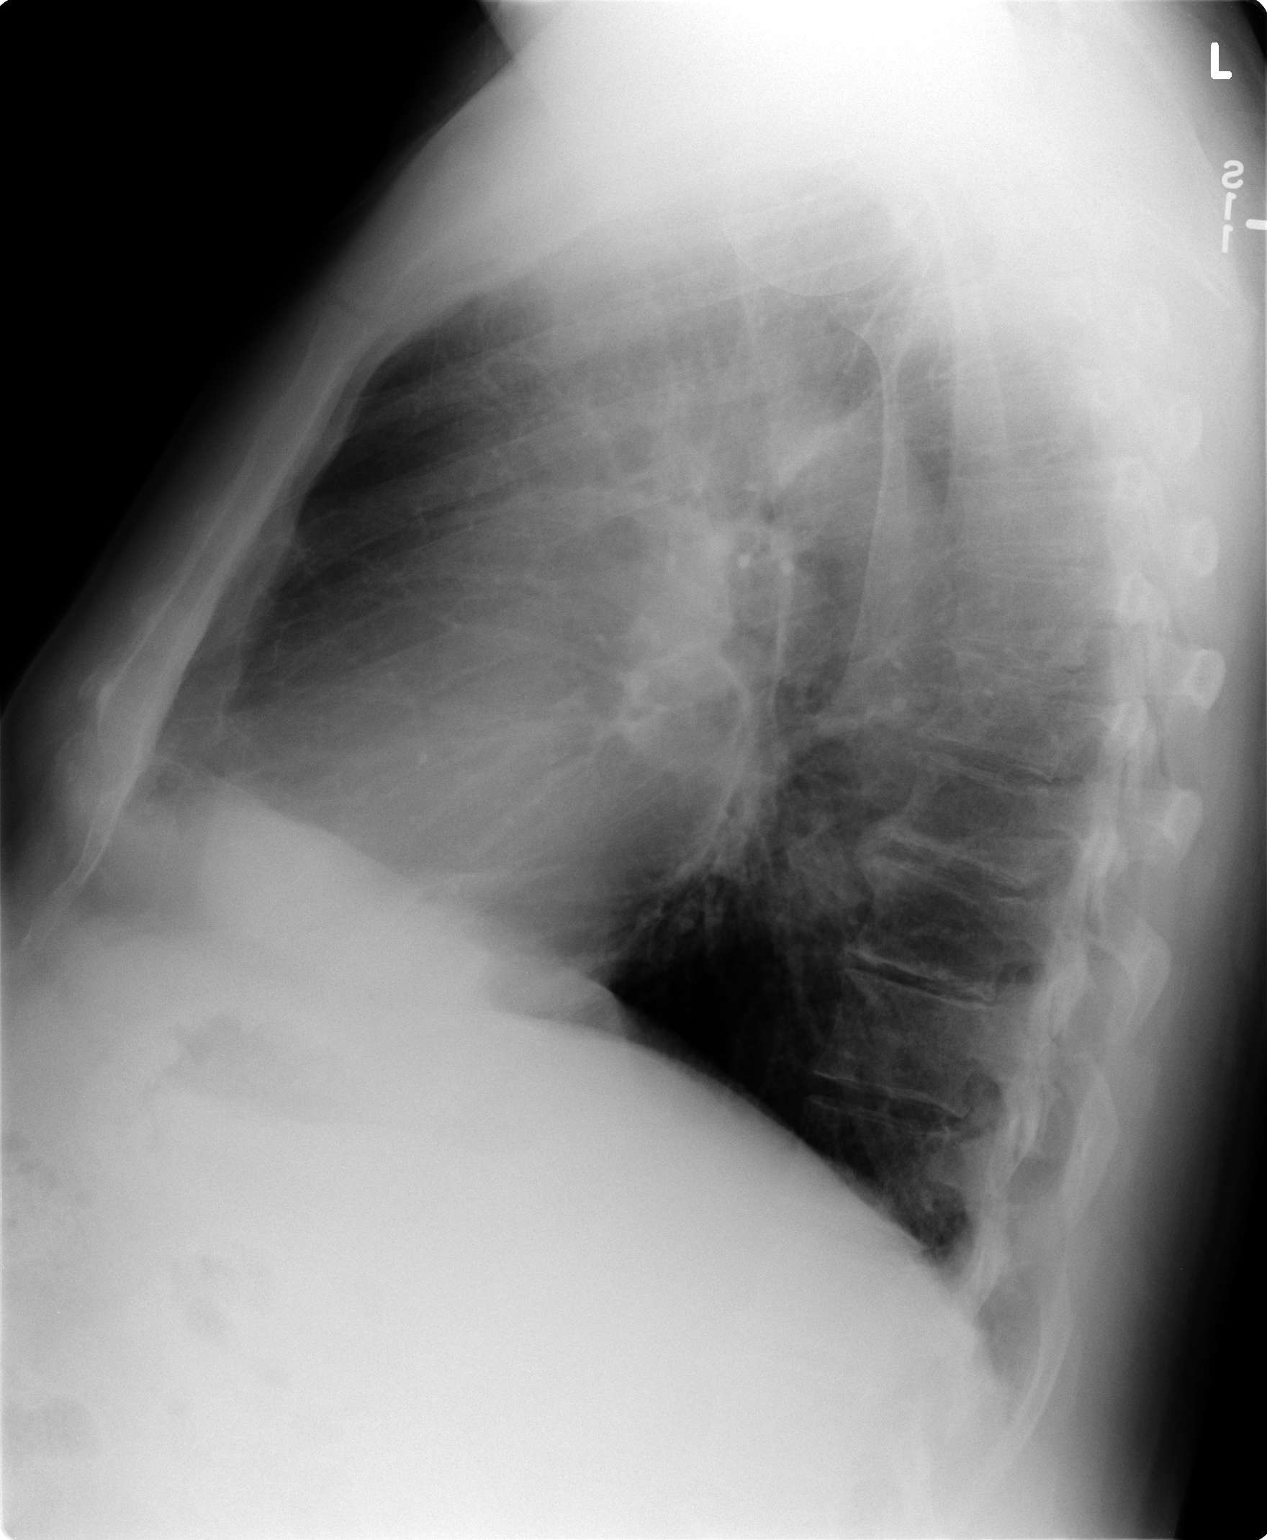

[2 of 2 positions shown; findings below may reference images not displayed]

FINDINGS: Lungs are clear. Heart size and pulmonary vascularity are normal. No
adenopathy. There is atherosclerotic calcification in the aortic
arch region. No bone lesions.
IMPRESSION: No edema or consolidation.

## 2017-02-03 DIAGNOSIS — L570 Actinic keratosis: Secondary | ICD-10-CM | POA: Diagnosis not present

## 2017-02-03 DIAGNOSIS — I8310 Varicose veins of unspecified lower extremity with inflammation: Secondary | ICD-10-CM | POA: Diagnosis not present

## 2017-02-03 DIAGNOSIS — D235 Other benign neoplasm of skin of trunk: Secondary | ICD-10-CM | POA: Diagnosis not present

## 2017-02-03 DIAGNOSIS — L821 Other seborrheic keratosis: Secondary | ICD-10-CM | POA: Diagnosis not present

## 2017-02-09 ENCOUNTER — Encounter: Payer: Self-pay | Admitting: Family Medicine

## 2017-02-09 MED ORDER — ATORVASTATIN CALCIUM 40 MG PO TABS: ORAL_TABLET | ORAL | 1 refills | Status: DC

## 2017-02-09 NOTE — Progress Notes (Signed)
Cardiology Office Note   Date:  02/11/2017   ID:  Larry Butler, DOB 11-10-1940, MRN [id number]  PCP:  Larry Fraction, MD  Cardiologist:   Larry Breeding, MD   Chief Complaint  Patient presents with  . Coronary Artery Disease      History of Present Illness: Larry Butler is a 76 y.o. male who presents for follow-up of coronary disease.  He previously lived in [place].  He's been followed for years by a cardiologist but has not had any severe problems as far as I can tell.   He had a cardiac catheterization in 2008 with apparently nonobstructive disease. A 2013 stress test demonstrated some mild mid inferior ischemia but this was apparently managed medically. His last stress test was in February 2015.   An echocardiogram from 2013 demonstrated well-preserved ejection Butler. There was some mild aortic stenosis.  Since I last saw him he's been hospitalized twice with small bowel obstruction. Apparently this is going to be recurrent problem  He has learned how to try to change his diet to avoid this. He does get short of breath with activity such as climbing up an incline but this is chronic.  He also reports dyspnea with sex.  However, he does not have chest pain.    He was able to dig up a tree stump recently without any complaints.   Of note he did have a rash on his legs and was told that he had some chronic venous stasis changes and dermatitis related to this and he is wearing compression stockings and using steroid cream.   Past Medical History:  Diagnosis Date  . Arthritis    "all over" (12/29/2016)  . Asthma    rarely uses inhalers/ followed by PCP  . Chronic kidney disease   . COPD (chronic obstructive pulmonary disease) ([place])   . GERD (gastroesophageal reflux disease)   . Gout   . HOH (hard of hearing)   . Hypertension   . Partial small bowel obstruction 08/14/2016  . Pneumonia 1962  . PONV (postoperative nausea and vomiting) 1985  . Renal lesion    4mm, on  right, Incidental finding on CT abdomen and pelvis /notes 08/14/2016  . Shortness of breath dyspnea    04/01/16  states no changes- "with exertion"    Past Surgical History:  Procedure Laterality Date  . APPENDECTOMY    . CARDIAC CATHETERIZATION  2008; ~ 2013-2014  . CATARACT EXTRACTION W/ INTRAOCULAR LENS  IMPLANT, BILATERAL Bilateral   . KNEE ARTHROSCOPY Right 04/08/2016   Procedure: RIGHT ARTHROSCOPY KNEE WITH MENISCAL DEBRIDEMENT, AND CONDROPLASTY;  Surgeon: Larry Arabian, MD;  Location: [facility];  Service: Orthopedics;  Laterality: Right;  . NASAL SEPTUM SURGERY    . POSTERIOR LUMBAR FUSION  1980s   "took bone from my right hip"  . PROSTATE SURGERY  ~ 2015   Larry Butler 11/30/2014  . TONSILECTOMY/ADENOIDECTOMY WITH MYRINGOTOMY       Current Outpatient Prescriptions  Medication Sig Dispense Refill  . acetaminophen (TYLENOL) 500 MG tablet Take 500-1,000 mg by mouth every 6 (six) hours as needed (For pain.).    Marland Kitchen albuterol (PROVENTIL HFA;VENTOLIN HFA) 108 (90 BASE) MCG/ACT inhaler Inhale 2 puffs into the lungs every 6 (six) hours as needed for wheezing or shortness of breath. 1 Inhaler 5  . amLODipine (NORVASC) 2.5 MG tablet Take 1 tablet (2.5 mg total) by mouth daily after supper. 90 tablet 3  . aspirin EC 81 MG tablet Take 81 mg  by mouth daily.    Marland Kitchen atorvastatin (LIPITOR) 40 MG tablet TAKE 1 TABLET BY MOUTH DAILY AT 6 PM 90 tablet 1  . esomeprazole (NEXIUM) 40 MG capsule TAKE 1 CAPSULE(40 MG) BY MOUTH DAILY AS NEEDED 30 capsule 0  . fluticasone (FLONASE) 50 MCG/ACT nasal spray Place 2 sprays into both nostrils daily as needed for allergies.     . Polyvinyl Alcohol-Povidone (REFRESH OP) Place 2 drops into both eyes daily as needed (For dry eyes.).     No current facility-administered medications for this visit.     Allergies:   Patient has no known allergies.     ROS:  Please see the history of present illness.   Otherwise, review of systems are positive for temporal headaches, reflux.    All other systems are reviewed and negative.    PHYSICAL EXAM: VS:  BP 128/62   Pulse 76   Ht 5\' 7"  (1.702 m)   Wt 206 lb (93.4 kg)   BMI 32.26 kg/m  , BMI Body mass index is 32.26 kg/m. GENERAL:  Well appearing NECK:  No jugular venous distention, waveform within normal limits, carotid upstroke brisk and symmetric, no bruits, no thyromegaly LUNGS:  Clear to auscultation bilaterally BACK:  No CVA tenderness CHEST:  Unremarkable HEART:  PMI not displaced or sustained,S1 and S2 within normal limits, no S3, no S4, no clicks, no rubs, no murmurs ABD:  Flat, positive bowel sounds normal in frequency in pitch, no bruits, no rebound, no guarding, no midline pulsatile mass, no hepatomegaly, no splenomegaly EXT:  2 plus pulses throughout, bilateral mild edema, no cyanosis no clubbing   EKG:  EKG is not ordered today.    Recent Labs: 12/29/2016: ALT 22; Hemoglobin 15.5; Platelets 175 12/30/2016: BUN 27; Creatinine, Ser 1.52; Potassium 4.0; Sodium 142; TSH 0.901    Lipid Panel    Component Value Date/Time   CHOL 197 12/30/2016 0413   TRIG 111 12/30/2016 0413   HDL 32 (L) 12/30/2016 0413   CHOLHDL 6.2 12/30/2016 0413   VLDL 22 12/30/2016 0413   LDLCALC 143 (H) 12/30/2016 0413      Wt Readings from Last 3 Encounters:  02/10/17 206 lb (93.4 kg)  01/08/17 205 lb 12.8 oz (93.4 kg)  12/31/16 211 lb 6.4 oz (95.9 kg)      Other studies Reviewed: Additional studies/ records that were reviewed today include:  Hospital recrods Review of the above records demonstrates:  See above   ASSESSMENT AND PLAN:  CAD:   This is mild and nonobstructive. No further testing needs to be done.  We did again today discuss at length primary risk reduction to include exercise.  HTN:  The blood pressure is at target. No change in therapy is indicated.  CAROTID STENOSIS:  He's had some very mild plaque.   No follow up is indicated at this time     DYSLIPIDEMIA:   He was started on Lipitor in the  hospital. He'll get a lipid profile liver enzymes.   Current medicines are reviewed at length with the patient today.  The patient does not have concerns regarding medicines.  The following changes have been made:  None  Labs/ tests ordered today include:      Disposition:   FU with me in 12 months.    Signed, Larry Breeding, MD  02/11/2017 7:40 AM    [facility]

## 2017-02-10 ENCOUNTER — Encounter: Payer: Self-pay | Admitting: Cardiology

## 2017-02-10 ENCOUNTER — Ambulatory Visit (INDEPENDENT_AMBULATORY_CARE_PROVIDER_SITE_OTHER): Payer: Medicare Other | Admitting: Cardiology

## 2017-02-10 VITALS — BP 128/62 | HR 76 | Ht 67.0 in | Wt 206.0 lb

## 2017-02-10 DIAGNOSIS — I2510 Atherosclerotic heart disease of native coronary artery without angina pectoris: Secondary | ICD-10-CM | POA: Diagnosis not present

## 2017-02-10 DIAGNOSIS — E785 Hyperlipidemia, unspecified: Secondary | ICD-10-CM | POA: Diagnosis not present

## 2017-02-10 MED ORDER — AMLODIPINE BESYLATE 2.5 MG PO TABS: 2.5000 mg | ORAL_TABLET | Freq: Every day | ORAL | 3 refills | Status: AC

## 2017-02-10 NOTE — Patient Instructions (Signed)
Medication Instructions:   NO CHANGE  Labwork:  Your physician recommends that you return for lab work WHEN FASTING  Follow-Up:  Your physician wants you to follow-up in: [place] will receive a reminder letter in the mail two months in advance. If you don't receive a letter, please call our office to schedule the follow-up appointment.   If you need a refill on your cardiac medications before your next appointment, please call your pharmacy.

## 2017-02-11 ENCOUNTER — Encounter: Payer: Self-pay | Admitting: Cardiology

## 2017-02-26 DIAGNOSIS — I2510 Atherosclerotic heart disease of native coronary artery without angina pectoris: Secondary | ICD-10-CM | POA: Diagnosis not present

## 2017-02-26 LAB — LIPID PANEL
Cholesterol: 118 mg/dL (ref <200)
HDL: 45 mg/dL (ref >40)
LDL CALC: 59 mg/dL (ref <100)
Total CHOL/HDL Ratio: 2.6 Ratio (ref <5.0)
Triglycerides: 68 mg/dL (ref <150)
VLDL: 14 mg/dL (ref <30)

## 2017-02-26 LAB — HEPATIC FUNCTION PANEL
ALBUMIN: 4.3 g/dL (ref 3.6–5.1)
ALT: 14 U/L (ref 9–46)
AST: 16 U/L (ref 10–35)
Alkaline Phosphatase: 65 U/L (ref 40–115)
BILIRUBIN TOTAL: 0.6 mg/dL (ref 0.2–1.2)
Bilirubin, Direct: 0.1 mg/dL (ref <=0.2)
Indirect Bilirubin: 0.5 mg/dL (ref 0.2–1.2)
Total Protein: 6.3 g/dL (ref 6.1–8.1)

## 2017-03-03 ENCOUNTER — Encounter: Payer: Self-pay | Admitting: Cardiology

## 2017-03-18 ENCOUNTER — Ambulatory Visit (INDEPENDENT_AMBULATORY_CARE_PROVIDER_SITE_OTHER): Payer: Medicare Other | Admitting: Physician Assistant

## 2017-03-18 ENCOUNTER — Encounter: Payer: Self-pay | Admitting: Physician Assistant

## 2017-03-18 VITALS — BP 122/78 | HR 87 | Temp 98.3°F | Resp 16 | Wt 203.6 lb

## 2017-03-18 DIAGNOSIS — J988 Other specified respiratory disorders: Secondary | ICD-10-CM | POA: Diagnosis not present

## 2017-03-18 DIAGNOSIS — J029 Acute pharyngitis, unspecified: Secondary | ICD-10-CM

## 2017-03-18 DIAGNOSIS — B9689 Other specified bacterial agents as the cause of diseases classified elsewhere: Secondary | ICD-10-CM

## 2017-03-18 DIAGNOSIS — Z8719 Personal history of other diseases of the digestive system: Secondary | ICD-10-CM | POA: Diagnosis not present

## 2017-03-18 LAB — STREP GROUP A AG, W/REFLEX TO CULT: STREGTOCOCCUS GROUP A AG SCREEN: NOT DETECTED

## 2017-03-18 MED ORDER — AZITHROMYCIN 250 MG PO TABS: ORAL_TABLET | ORAL | 0 refills | Status: DC

## 2017-03-18 NOTE — Progress Notes (Signed)
Patient ID: Larry Butler MRN: [id number], DOB: 1941/04/23, 76 y.o. Date of Encounter: 03/18/2017, 9:57 AM    Chief Complaint:  Chief Complaint  Patient presents with  . Sore Throat    x3days  . ear pain both ears  . Headache  . Cough  . sneezing  . Fever  . bowel blockage     HPI: 76 y.o. year old male presents with above.   Patient's wife is also being seen for office visit today. She just started to develop congestion and cough yesterday morning so she is being seen for that.  However they report that patient's symptoms started on Sunday 03/15/17. He has been having sore throat. He has been feeling pressure and congestion in both ears. Has had some headache and nasal congestion. Also cough and chest congestion. States that yesterday had fever 101. He has taken some Tylenol and cough medicine for this.  They also report that he has history of "bowel blockage". Says that in the past he "was told that he has one area, where small intestine and large intestine come together--that area is much more narrow than it is supposed to be--and that if he ever starts having symptoms of getting blocked, to drink lots of water so that's what he has done" and says that he did then have bowel movement. Notes that his abdomen is now soft and that he has been having a lot of belching and passing gas.     Home Meds:   Outpatient Medications Prior to Visit  Medication Sig Dispense Refill  . acetaminophen (TYLENOL) 500 MG tablet Take 500-1,000 mg by mouth every 6 (six) hours as needed (For pain.).    Marland Kitchen albuterol (PROVENTIL HFA;VENTOLIN HFA) 108 (90 BASE) MCG/ACT inhaler Inhale 2 puffs into the lungs every 6 (six) hours as needed for wheezing or shortness of breath. 1 Inhaler 5  . amLODipine (NORVASC) 2.5 MG tablet Take 1 tablet (2.5 mg total) by mouth daily after supper. 90 tablet 3  . aspirin EC 81 MG tablet Take 81 mg by mouth daily.    Marland Kitchen atorvastatin (LIPITOR) 40 MG tablet TAKE 1 TABLET BY  MOUTH DAILY AT 6 PM 90 tablet 1  . esomeprazole (NEXIUM) 40 MG capsule TAKE 1 CAPSULE(40 MG) BY MOUTH DAILY AS NEEDED 30 capsule 0  . fluticasone (FLONASE) 50 MCG/ACT nasal spray Place 2 sprays into both nostrils daily as needed for allergies.     . Polyvinyl Alcohol-Povidone (REFRESH OP) Place 2 drops into both eyes daily as needed (For dry eyes.).     No facility-administered medications prior to visit.     Allergies: No Known Allergies    Review of Systems: See HPI for pertinent ROS. All other ROS negative.    Physical Exam: Blood pressure 122/78, pulse 87, temperature 98.3 F (36.8 C), temperature source Oral, resp. rate 16, weight 203 lb 9.6 oz (92.4 kg), SpO2 98 %., Body mass index is 31.89 kg/m. General: WM.  Appears in no acute distress. HEENT: Normocephalic, atraumatic, eyes without discharge, sclera non-icteric, nares are without discharge. Bilateral auditory canals clear, TM's are without perforation, pearly grey and translucent with reflective cone of light bilaterally. Oral cavity moist, posterior pharynx with moderate erythema. No exudate, no peritonsillar abscess. No tenderness with percussion to frontal or maxillary sinuses bilaterally.  Neck: Supple. No thyromegaly. He does report some tenderness with palpation of bilateral cervical nodes. However they do not feel enlarged. Lungs: Clear bilaterally to auscultation without wheezes, rales, or  rhonchi. Breathing is unlabored. Heart: Regular rhythm. No murmurs, rubs, or gallops. Abdomen: Soft, non-tender, non-distended with normoactive bowel sounds. No hepatomegaly. No rebound/guarding. No obvious abdominal masses. He is having normal bowel sounds. His abdomen is soft and there is no area of tenderness. He does have some belching through his visit. Msk:  Strength and tone normal for age. Extremities/Skin: Warm and dry.  Neuro: Alert and oriented X 3. Moves all extremities spontaneously. Gait is normal. CNII-XII grossly in  tact. Psych:  Responds to questions appropriately with a normal affect.     ASSESSMENT AND PLAN:  76 y.o. year old male with    1. Bacterial respiratory infection - azithromycin (ZITHROMAX) 250 MG tablet; Day 1: Take 2 daily. Days 2 - 5: Take 1 daily.  Dispense: 6 tablet; Refill: 0  2. Sore throat - STREP GROUP A AG, W/REFLEX TO CULT  3. H/O small bowel obstruction   Rapid strep test negative. He is to take the azithromycin as directed. Follow-up if these symptoms worsen or do not resolve within 1 week after completion of antibiotic. Follow-up if has recurrent symptoms of bowel obstruction. At this time it seems that this has resolved and is stable. He has passed stool and flatulence and on exam his abdomen is soft with normal bowel sounds and no tenderness.   Signed, [address] [place], [place], [facility] 03/18/2017 9:57 AM

## 2017-03-20 LAB — CULTURE, GROUP A STREP

## 2017-03-27 ENCOUNTER — Ambulatory Visit (INDEPENDENT_AMBULATORY_CARE_PROVIDER_SITE_OTHER): Payer: Medicare Other | Admitting: Family Medicine

## 2017-03-27 ENCOUNTER — Encounter: Payer: Self-pay | Admitting: Family Medicine

## 2017-03-27 VITALS — BP 108/70 | HR 82 | Temp 98.3°F | Resp 18 | Ht 67.0 in | Wt 206.0 lb

## 2017-03-27 DIAGNOSIS — H60391 Other infective otitis externa, right ear: Secondary | ICD-10-CM

## 2017-03-27 DIAGNOSIS — H9193 Unspecified hearing loss, bilateral: Secondary | ICD-10-CM | POA: Diagnosis not present

## 2017-03-27 MED ORDER — ESOMEPRAZOLE MAGNESIUM 40 MG PO CPDR: DELAYED_RELEASE_CAPSULE | ORAL | 11 refills | Status: AC

## 2017-03-27 MED ORDER — PREDNISONE 20 MG PO TABS: ORAL_TABLET | ORAL | 0 refills | Status: AC

## 2017-03-27 MED ORDER — NEOMYCIN-POLYMYXIN-HC 3.5-10000-1 OT SOLN: 4.0000 [drp] | Freq: Four times a day (QID) | OTIC | 0 refills | Status: AC

## 2017-03-27 NOTE — Progress Notes (Signed)
Subjective:    Patient ID: Larry Butler, male    DOB: 18-Oct-1941, 76 y.o.   MRN: [id number]  HPI Patient was seen May 16 and was treated with Zithromax for respiratory tract infection. Symptoms included rhinorrhea, head congestion, nonproductive cough. Cough has improved however he continues to have significant head congestion. However his biggest concern is hearing loss. He states that he cannot hear anything in his right ear. He has diminished hearing in his left ear although slightly better. He denies any sinus pain. He denies any rhinorrhea. However he is extremely congested and has a very "nasal voice". Examination shows 90% obstruction of the right auditory canal with wax. Left auditory canal is completely clear. There is no visible effusion behind the left tympanic membrane. I am unable to directly visualize the right tympanic membrane adequately due to cerumen impaction.  Past Medical History:  Diagnosis Date  . Arthritis    "all over" (12/29/2016)  . Asthma    rarely uses inhalers/ followed by PCP  . Chronic kidney disease   . COPD (chronic obstructive pulmonary disease) ([place])   . GERD (gastroesophageal reflux disease)   . Gout   . HOH (hard of hearing)   . Hypertension   . Partial small bowel obstruction ([place]) 08/14/2016  . Pneumonia 1962  . PONV (postoperative nausea and vomiting) 1985  . Renal lesion    38mm, on right, Incidental finding on CT abdomen and pelvis /notes 08/14/2016  . Shortness of breath dyspnea    04/01/16  states no changes- "with exertion"   Past Surgical History:  Procedure Laterality Date  . APPENDECTOMY    . CARDIAC CATHETERIZATION  2008; ~ 2013-2014  . CATARACT EXTRACTION W/ INTRAOCULAR LENS  IMPLANT, BILATERAL Bilateral   . KNEE ARTHROSCOPY Right 04/08/2016   Procedure: RIGHT ARTHROSCOPY KNEE WITH MENISCAL DEBRIDEMENT, AND CONDROPLASTY;  Surgeon: Gaynelle Arabian, MD;  Location: [facility];  Service: Orthopedics;  Laterality: Right;  . NASAL SEPTUM  SURGERY    . POSTERIOR LUMBAR FUSION  1980s   "took bone from my right hip"  . PROSTATE SURGERY  ~ 2015   Archie Endo 11/30/2014  . TONSILECTOMY/ADENOIDECTOMY WITH MYRINGOTOMY     Current Outpatient Prescriptions on File Prior to Visit  Medication Sig Dispense Refill  . acetaminophen (TYLENOL) 500 MG tablet Take 500-1,000 mg by mouth every 6 (six) hours as needed (For pain.).    Marland Kitchen albuterol (PROVENTIL HFA;VENTOLIN HFA) 108 (90 BASE) MCG/ACT inhaler Inhale 2 puffs into the lungs every 6 (six) hours as needed for wheezing or shortness of breath. 1 Inhaler 5  . amLODipine (NORVASC) 2.5 MG tablet Take 1 tablet (2.5 mg total) by mouth daily after supper. 90 tablet 3  . aspirin EC 81 MG tablet Take 81 mg by mouth daily.    Marland Kitchen atorvastatin (LIPITOR) 40 MG tablet TAKE 1 TABLET BY MOUTH DAILY AT 6 PM 90 tablet 1  . fluticasone (FLONASE) 50 MCG/ACT nasal spray Place 2 sprays into both nostrils daily as needed for allergies.     . Polyvinyl Alcohol-Povidone (REFRESH OP) Place 2 drops into both eyes daily as needed (For dry eyes.).     No current facility-administered medications on file prior to visit.    No Known Allergies Social History   Social History  . Marital status: Married    Spouse name: N/A  . Number of children: N/A  . Years of education: N/A   Occupational History  . Not on file.   Social History Main  Topics  . Smoking status: Former Smoker    Packs/day: 3.00    Years: 15.00    Types: Cigarettes    Quit date: 09/03/1977  . Smokeless tobacco: Never Used  . Alcohol use No  . Drug use: No  . Sexual activity: No   Other Topics Concern  . Not on file   Social History Narrative   One kind of adopted daughter.      Review of Systems  All other systems reviewed and are negative.      Objective:   Physical Exam  HENT:  Head: Normocephalic and atraumatic.  Right Ear: There is drainage. No swelling or tenderness. Tympanic membrane is not injected, not scarred, not perforated,  not erythematous, not retracted and not bulging. No middle ear effusion. Decreased hearing is noted.  Left Ear: No drainage, swelling or tenderness. Tympanic membrane is not injected, not scarred, not perforated, not erythematous, not retracted and not bulging.  No middle ear effusion. Decreased hearing is noted.  Nose: Mucosal edema and rhinorrhea present. Right sinus exhibits no maxillary sinus tenderness and no frontal sinus tenderness. Left sinus exhibits no maxillary sinus tenderness and no frontal sinus tenderness.  Cardiovascular: Normal rate, regular rhythm and normal heart sounds.   Pulmonary/Chest: Effort normal and breath sounds normal. No respiratory distress. He has no wheezes. He has no rales.  Vitals reviewed.  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 Hearing screen was still markedly abnormal. I believe the patient likely has eustachian tube dysfunction secondary to sinusitis. I will treat this with prednisone taper pack. I will treat the otitis externa with Cortisporin HC otic drops, 4 drops in the right ear 4 times a day. Reassess next week. If hearing loss is not better, refer the patient to ENT for evaluation of sudden sensorineural hearing loss.

## 2017-03-31 ENCOUNTER — Telehealth: Payer: Self-pay | Admitting: Family Medicine

## 2017-03-31 DIAGNOSIS — H9120 Sudden idiopathic hearing loss, unspecified ear: Secondary | ICD-10-CM

## 2017-03-31 NOTE — Telephone Encounter (Signed)
Referral placed and pt's wife aware

## 2017-03-31 NOTE — Telephone Encounter (Signed)
[place] with ent consult.

## 2017-03-31 NOTE — Telephone Encounter (Signed)
Pt's wife called to report that his hearing is some better but not a lot and she said you were going to send him to see ENT if not a lot better!?!

## 2017-05-01 DIAGNOSIS — H9113 Presbycusis, bilateral: Secondary | ICD-10-CM | POA: Diagnosis not present

## 2017-05-01 DIAGNOSIS — H903 Sensorineural hearing loss, bilateral: Secondary | ICD-10-CM | POA: Diagnosis not present

## 2017-06-02 DIAGNOSIS — N401 Enlarged prostate with lower urinary tract symptoms: Secondary | ICD-10-CM | POA: Diagnosis not present

## 2017-07-14 ENCOUNTER — Ambulatory Visit (INDEPENDENT_AMBULATORY_CARE_PROVIDER_SITE_OTHER): Payer: Medicare Other | Admitting: Family Medicine

## 2017-07-14 DIAGNOSIS — Z23 Encounter for immunization: Secondary | ICD-10-CM

## 2017-07-14 MED ORDER — ATORVASTATIN CALCIUM 40 MG PO TABS: ORAL_TABLET | ORAL | 1 refills | Status: DC

## 2017-07-25 IMAGING — CT CT ABD-PELV W/ CM
2 of 5 series · 8 of 46 positions shown, 9 images · IV contrast (Iodine)
Comparison: Abdominal x-ray 08/14/2016

CLINICAL DATA: Right side abdominal pain for 5 days, diarrhea,
possible small bowel obstruction

EXAM:
CT ABDOMEN AND PELVIS WITH CONTRAST
TECHNIQUE: Multidetector CT imaging of the abdomen and pelvis was performed
using the standard protocol following bolus administration of
intravenous contrast.
CONTRAST:  100mL 63XSG3-0XX IOPAMIDOL (63XSG3-0XX) INJECTION 61%

[Series 201: routine, idose (2) · axial · 0.82mm/px · z∈[+87,+482]mm · 5 of 101 slices shown, 6 images]
[im 11/101  soft-tissue]
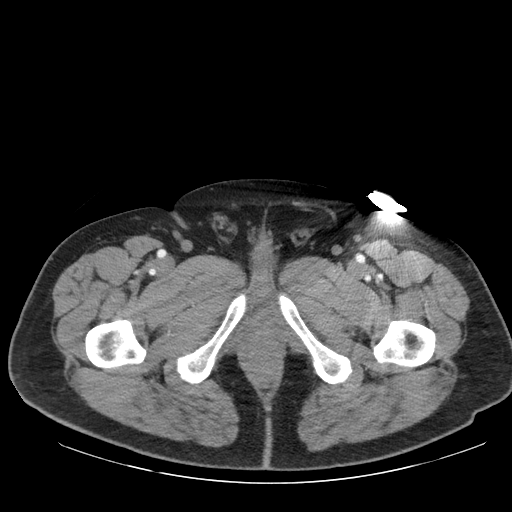
[im 11/101  bone]
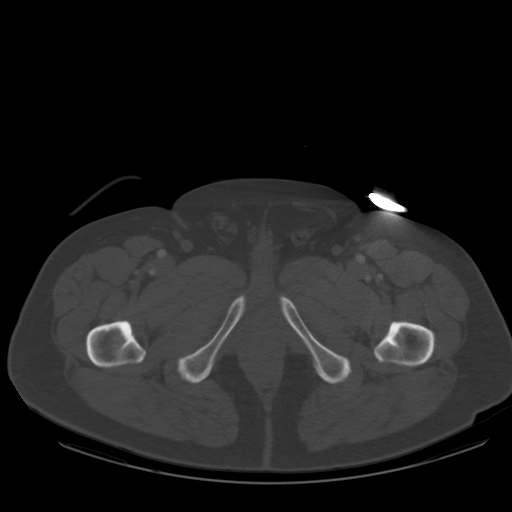
[im 32/101  soft-tissue]
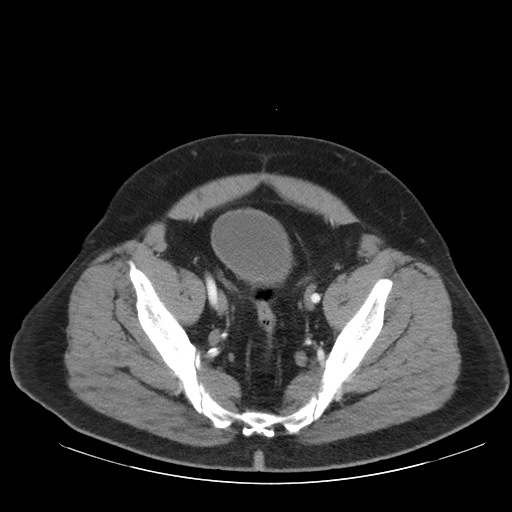
[im 53/101  soft-tissue]
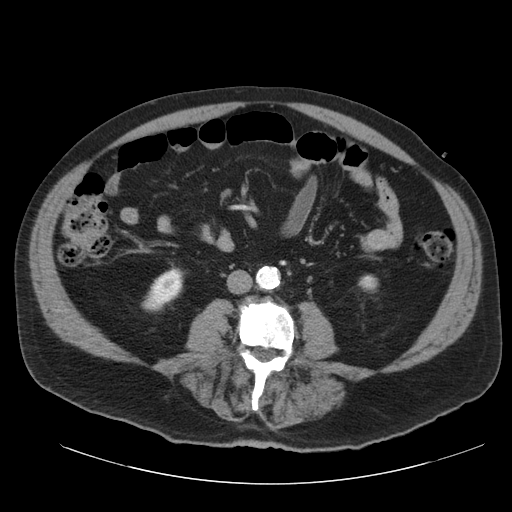
[im 69/101  soft-tissue]
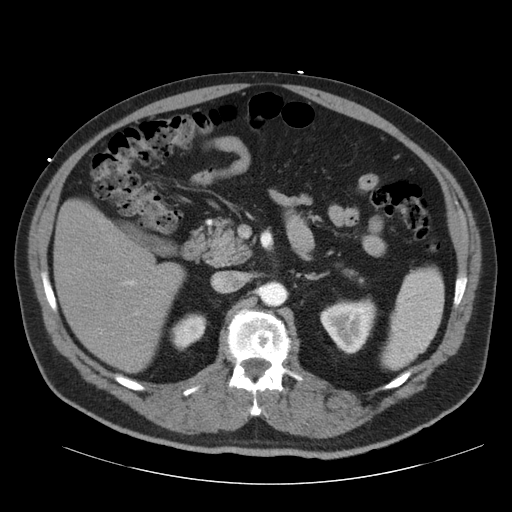
[im 90/101  soft-tissue]
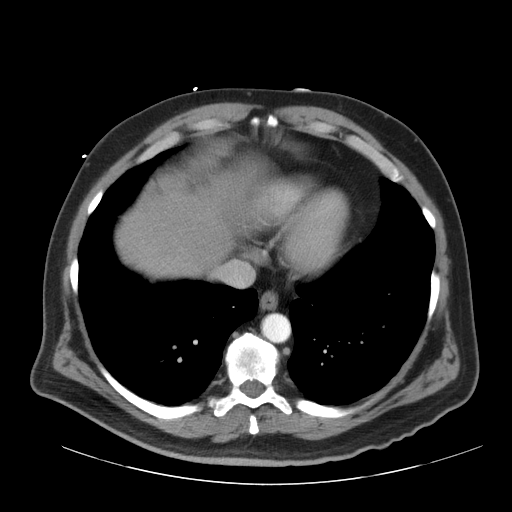

[Series 203: coronals, idose (2) · coronal · 0.45mm/px · 3 of 143 slices shown]
[im 48/143  soft-tissue]
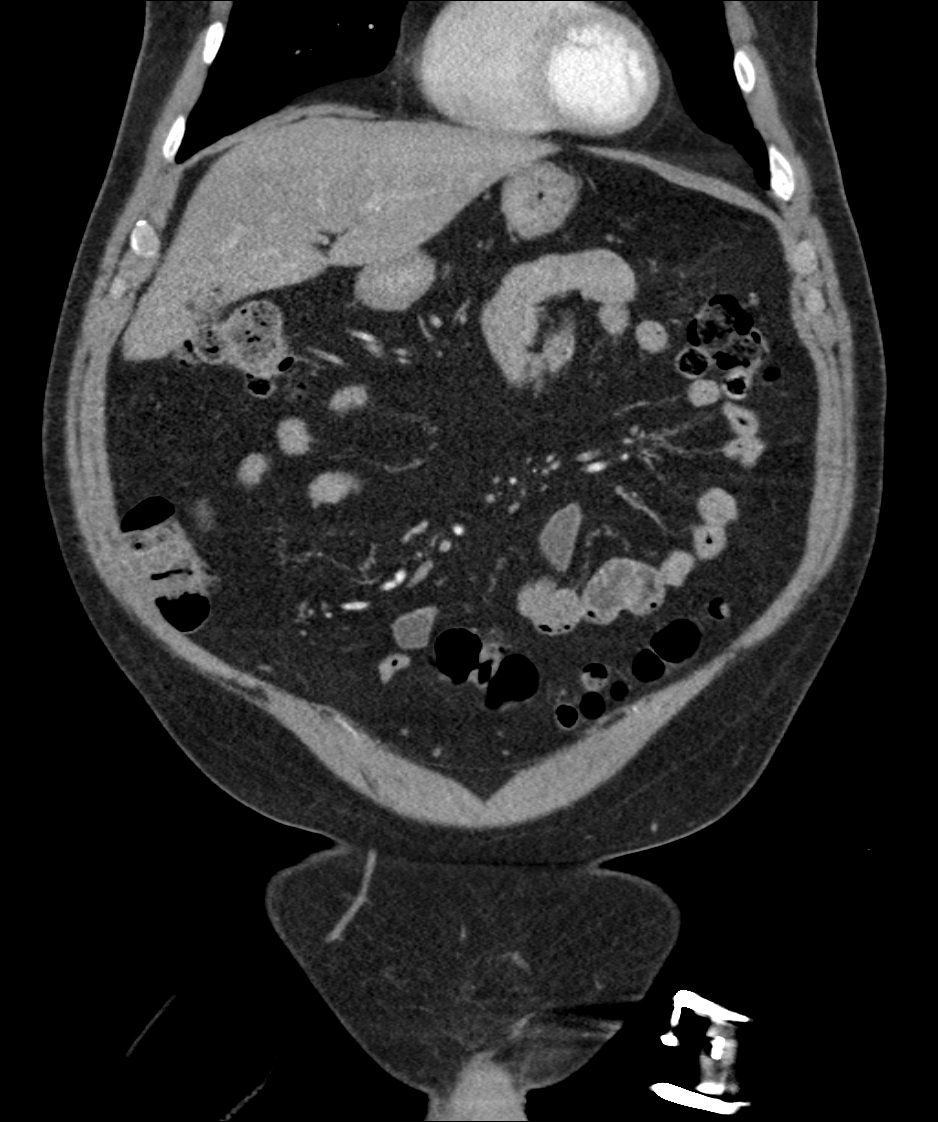
[im 64/143  soft-tissue]
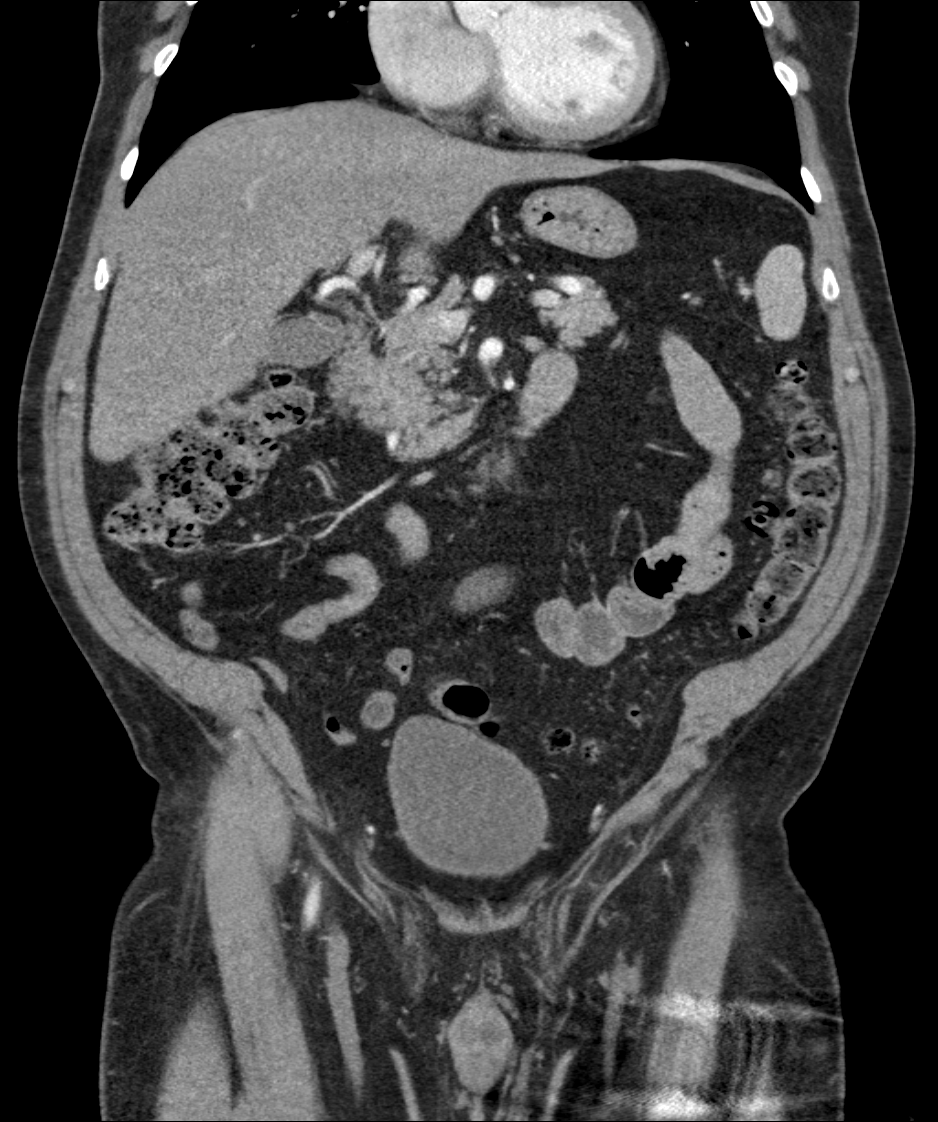
[im 79/143  soft-tissue]
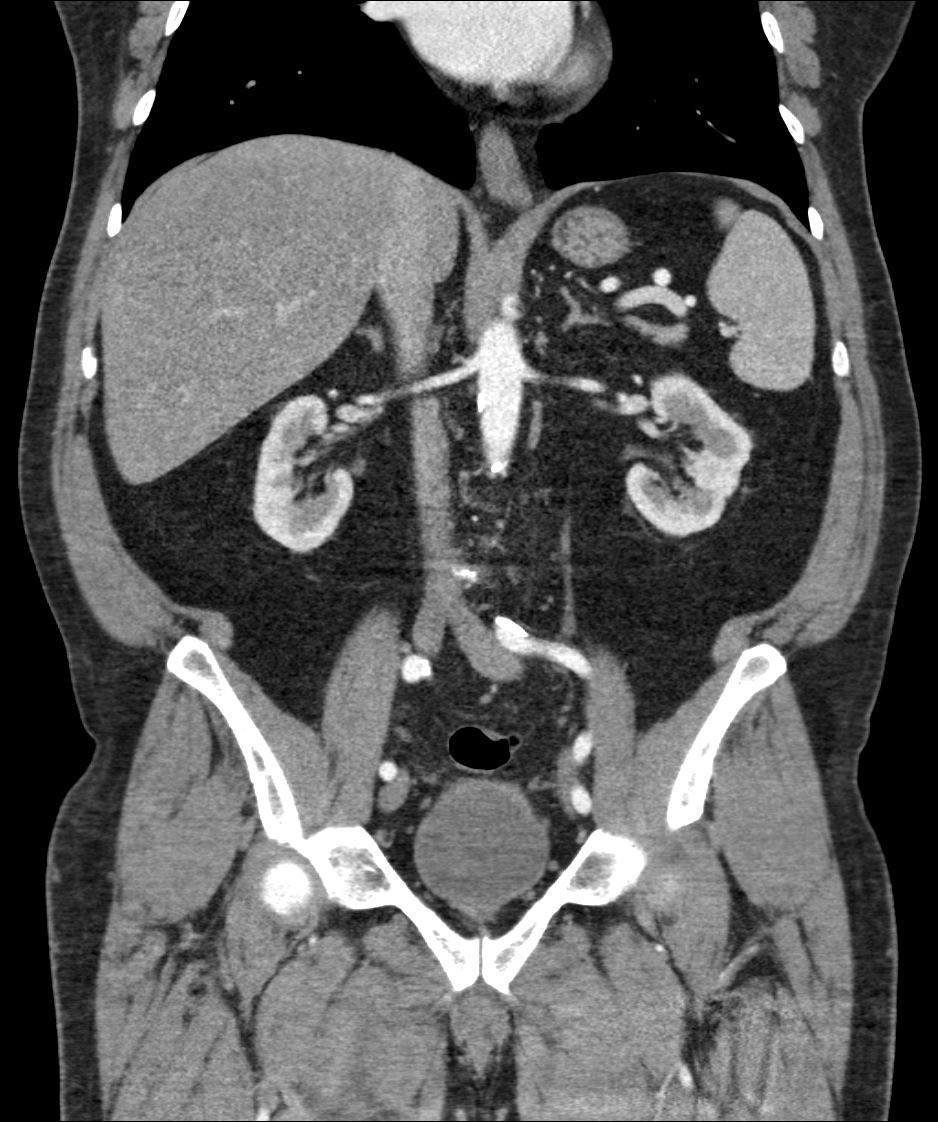

[8 of 46 positions shown; findings below may reference images not displayed]

FINDINGS: Lower chest: The lungs are bases are unremarkable.

Hepatobiliary: Mild fatty infiltration of the liver. Few hepatic
cysts are noted the largest in left hepatic lobe measures 1 cm. No
calcified gallstones are noted within gallbladder.

Pancreas: Enhanced pancreas is unremarkable.

Spleen: Enhanced spleen is unremarkable.

Adrenals/Urinary Tract: No adrenal gland mass. Enhanced kidneys are
symmetrical in size. No hydronephrosis or hydroureter. There is
indeterminate lesion mid pole anterior aspect of the right kidney
measures about 8 mm. Further correlation with MRI is recommended to
exclude a renal cell carcinoma.

Delayed renal images shows bilateral renal symmetrical excretion.
Bilateral visualized proximal ureter is unremarkable. The urinary
bladder is unremarkable.

Stomach/Bowel: The study is limited without oral contrast. No
gastric outlet obstruction. The the there are dilated small bowel
loops with some air-fluid levels in mid abdomen and upper pelvis.
Axial image 37 there is transition point in caliber of small bowel.
Findings highly suspicious for early small bowel obstruction. Less
likely ileus or enteritis. Clinical correlation is necessary.
Terminal small bowel is decompressed small caliber. Moderate stool
noted in right colon and proximal transverse colon. There is no
pericecal inflammation. The appendix is not identified. Colonic
diverticula are noted descending colon and sigmoid colon. No
evidence of distal colitis or diverticulitis.

Vascular/Lymphatic: No aortic aneurysm. Atherosclerotic
calcifications of abdominal aorta and iliac arteries. No
retroperitoneal or mesenteric adenopathy.

Reproductive: Prostate gland seminal vesicles are unremarkable.

Other: No ascites or free abdominal air. Bilateral inguinal scrotal
canal small hernia containing fat without evidence of acute
complication.

Musculoskeletal: No destructive bony lesions are noted. Sagittal
images of the spine shows degenerative changes lumbar spine.
Degenerative changes bilateral SI joints.
IMPRESSION: 1. Mild distended small bowel loops with some air-fluid levels in
mid abdomen and upper pelvis. There is transition point in caliber
of small bowel in axial image 37. Findings highly suspicious for
partial small bowel obstruction. Less likely ileus or enteritis.
Clinical correlation is necessary.
2. No ascites or free abdominal air.
3. Moderate stool noted in right colon and proximal transverse
colon. No pericecal inflammation. Appendix is not identified.
4. Distal colonic diverticula.  No evidence of acute diverticulitis.
5. There is indeterminate lesion in midpole anterior aspect of the
right kidney measures about 8 mm. Further correlation with MRI is
recommended to exclude renal cell carcinoma.
6. Mild fatty infiltration of the liver.
7. Degenerative changes lumbar spine.

## 2017-11-09 ENCOUNTER — Telehealth: Payer: Self-pay | Admitting: Cardiology

## 2017-11-09 NOTE — Telephone Encounter (Signed)
New Message  Pt wife call requesting to speak with RN about getting a referral to a Cardiologist in [place]

## 2017-11-09 NOTE — Telephone Encounter (Signed)
Returned the call to the patient's wife per the dpr. She would like to know if Dr. Percival Spanish could recommend a cardiologist in [place], [place]. The wife's message has been routed to him for his recommendation.

## 2017-12-31 ENCOUNTER — Telehealth: Payer: Self-pay | Admitting: Family Medicine

## 2017-12-31 MED ORDER — ATORVASTATIN CALCIUM 40 MG PO TABS: ORAL_TABLET | ORAL | 0 refills | Status: AC

## 2017-12-31 MED ORDER — ALBUTEROL SULFATE HFA 108 (90 BASE) MCG/ACT IN AERS: 2.0000 | INHALATION_SPRAY | Freq: Four times a day (QID) | RESPIRATORY_TRACT | 1 refills | Status: AC | PRN

## 2017-12-31 NOTE — Telephone Encounter (Signed)
Pt has moved and would like a refill on his inhaler and lipitor as he has an apt with a MD where they are but not until the end of march. Med sent to requested pharm.
